# Patient Record
Sex: Male | Born: 1965
Health system: Southern US, Community
[De-identification: ages and names within clinical notes are randomized; demographics above are authoritative.]

## PROBLEM LIST (undated history)

## (undated) DIAGNOSIS — I10 Essential (primary) hypertension: Secondary | ICD-10-CM

## (undated) DIAGNOSIS — C801 Malignant (primary) neoplasm, unspecified: Secondary | ICD-10-CM

## (undated) DIAGNOSIS — M1990 Unspecified osteoarthritis, unspecified site: Secondary | ICD-10-CM

## (undated) DIAGNOSIS — Z87442 Personal history of urinary calculi: Secondary | ICD-10-CM

## (undated) DIAGNOSIS — I447 Left bundle-branch block, unspecified: Secondary | ICD-10-CM

---

## 2012-11-09 ENCOUNTER — Emergency Department (HOSPITAL_COMMUNITY): Payer: BC Managed Care – PPO

## 2012-11-09 ENCOUNTER — Encounter (HOSPITAL_COMMUNITY): Payer: Self-pay | Admitting: Emergency Medicine

## 2012-11-09 ENCOUNTER — Emergency Department (HOSPITAL_COMMUNITY)
Admission: EM | Admit: 2012-11-09 | Discharge: 2012-11-09 | Disposition: A | Discharge status: Home / Self Care | Payer: BC Managed Care – PPO | Attending: Emergency Medicine | Admitting: Emergency Medicine

## 2012-11-09 DIAGNOSIS — S93409A Sprain of unspecified ligament of unspecified ankle, initial encounter: Secondary | ICD-10-CM | POA: Insufficient documentation

## 2012-11-09 DIAGNOSIS — Y9289 Other specified places as the place of occurrence of the external cause: Secondary | ICD-10-CM | POA: Insufficient documentation

## 2012-11-09 DIAGNOSIS — F17200 Nicotine dependence, unspecified, uncomplicated: Secondary | ICD-10-CM | POA: Insufficient documentation

## 2012-11-09 DIAGNOSIS — Y9389 Activity, other specified: Secondary | ICD-10-CM | POA: Insufficient documentation

## 2012-11-09 DIAGNOSIS — R296 Repeated falls: Secondary | ICD-10-CM | POA: Insufficient documentation

## 2012-11-09 NOTE — ED Notes (Signed)
Pt refused aso, pa bryant notified.

## 2012-11-09 NOTE — ED Provider Notes (Signed)
CSN: [id number]     Arrival date & time 11/09/12  4098 History   First MD Initiated Contact with Patient 11/09/12 [phone]     Chief Complaint  Patient presents with  . Foot Pain   (Consider location/radiation/quality/duration/timing/severity/associated sxs/prior Treatment) Patient is a 47 y.o. male presenting with lower extremity pain. The history is provided by the patient.  Foot Pain This is a new problem. The current episode started yesterday. The problem occurs constantly. The problem has been gradually worsening. Pertinent negatives include no abdominal pain, arthralgias, chest pain, coughing, neck pain or numbness. The symptoms are aggravated by standing and walking. He has tried nothing for the symptoms. The treatment provided no relief.    History reviewed. No pertinent past medical history. History reviewed. No pertinent past surgical history. No family history on file. History  Substance Use Topics  . Smoking status: Current Every Day Smoker  . Smokeless tobacco: Not on file  . Alcohol Use: Yes     Comment: occ    Review of Systems  Constitutional: Negative for activity change.       All ROS Neg except as noted in HPI  HENT: Negative for nosebleeds.   Eyes: Negative for photophobia and discharge.  Respiratory: Negative for cough, shortness of breath and wheezing.   Cardiovascular: Negative for chest pain and palpitations.  Gastrointestinal: Negative for abdominal pain and blood in stool.  Genitourinary: Negative for dysuria, frequency and hematuria.  Musculoskeletal: Negative for arthralgias, back pain and neck pain.  Skin: Negative.   Neurological: Negative for dizziness, seizures, speech difficulty and numbness.  Psychiatric/Behavioral: Negative for hallucinations and confusion.    Allergies  Review of patient's allergies indicates no known allergies.  Home Medications  No current outpatient prescriptions on file. BP 108/74  Pulse 78  Temp(Src) 98.6 F (37 C)  (Oral)  Resp 18  Ht 5\' 9"  (1.753 m)  Wt 170 lb (77.111 kg)  BMI 25.09 kg/m2  SpO2 96% Physical Exam  Nursing note and vitals reviewed. Constitutional: He is oriented to person, place, and time. He appears well-developed and well-nourished.  Non-toxic appearance.  HENT:  Head: Normocephalic.  Right Ear: Tympanic membrane and external ear normal.  Left Ear: Tympanic membrane and external ear normal.  Eyes: EOM and lids are normal. Pupils are equal, round, and reactive to light.  Neck: Normal range of motion. Neck supple. Carotid bruit is not present.  Cardiovascular: Normal rate, regular rhythm, normal heart sounds, intact distal pulses and normal pulses.   Pulmonary/Chest: Breath sounds normal. No respiratory distress.  Abdominal: Soft. Bowel sounds are normal. There is no tenderness. There is no guarding.  Musculoskeletal: Normal range of motion.  Pain of the lateral malleolus of the right foot. FROM of the toes. Cap refill less than 2 sec. Achilles intact. No lesions between toes or on the plantar surface. DP pulse 2+  Lymphadenopathy:       Head (right side): No submandibular adenopathy present.       Head (left side): No submandibular adenopathy present.    He has no cervical adenopathy.  Neurological: He is alert and oriented to person, place, and time. He has normal strength. No cranial nerve deficit or sensory deficit.  Skin: Skin is warm and dry.  Psychiatric: He has a normal mood and affect. His speech is normal.    ED Course  Procedures (including critical care time) Labs Review Labs Reviewed - No data to display Imaging Review Dg Foot Complete Right  11/09/2012  CLINICAL DATA:  Pain and swelling in the proximal metatarsals since falling yesterday.  EXAM: RIGHT FOOT COMPLETE - 3+ VIEW  COMPARISON:  None.  FINDINGS: The mineralization and alignment are normal. There is no evidence of acute fracture or dislocation. The alignment at the Lisfranc joint appears normal. There  appears to be mild dorsal forefoot soft tissue swelling.  IMPRESSION: No acute osseous findings demonstrated.   Electronically Signed   By: Roxy Horseman M.D.   On: 11/09/2012 08:49    EKG Interpretation   None       MDM  No diagnosis found. *I have reviewed nursing notes, vital signs, and all appropriate lab and imaging results for this patient.**  Pt states he fell approximately 4 feet from a ladder on last evening. He's been having increasing pain. Pain is worse when he applies weight. X-ray of the right foot and ankle are negative for fracture or dislocation  Patient fitted with an ankle stirrup splint. Patient has his own crutches. Patient advised to apply ice and elevate the ankle. Patient will use ibuprofen every 6 hours for soreness. He is to see orthopedics if not improving.  Kathie Dike, PA-C 11/09/12 [phone]

## 2012-11-09 NOTE — ED Provider Notes (Signed)
Medical screening examination/treatment/procedure(s) were performed by non-physician practitioner and as supervising physician I was immediately available for consultation/collaboration.  Comfort Iversen L Oskar Cretella, MD 11/09/12 1644 

## 2012-11-09 NOTE — ED Notes (Signed)
Pt reports was working out in the yard yesterday and fell.  C/O pain to lateral part of r foot.

## 2015-02-20 IMAGING — CR DG FOOT COMPLETE 3+V*R*
3 series · 3 of 3 positions shown · non-contrast
Comparison: None.

CLINICAL DATA: Pain and swelling in the proximal metatarsals since
falling yesterday.

EXAM:
RIGHT FOOT COMPLETE - 3+ VIEW

[view not recorded (1 of 3)]
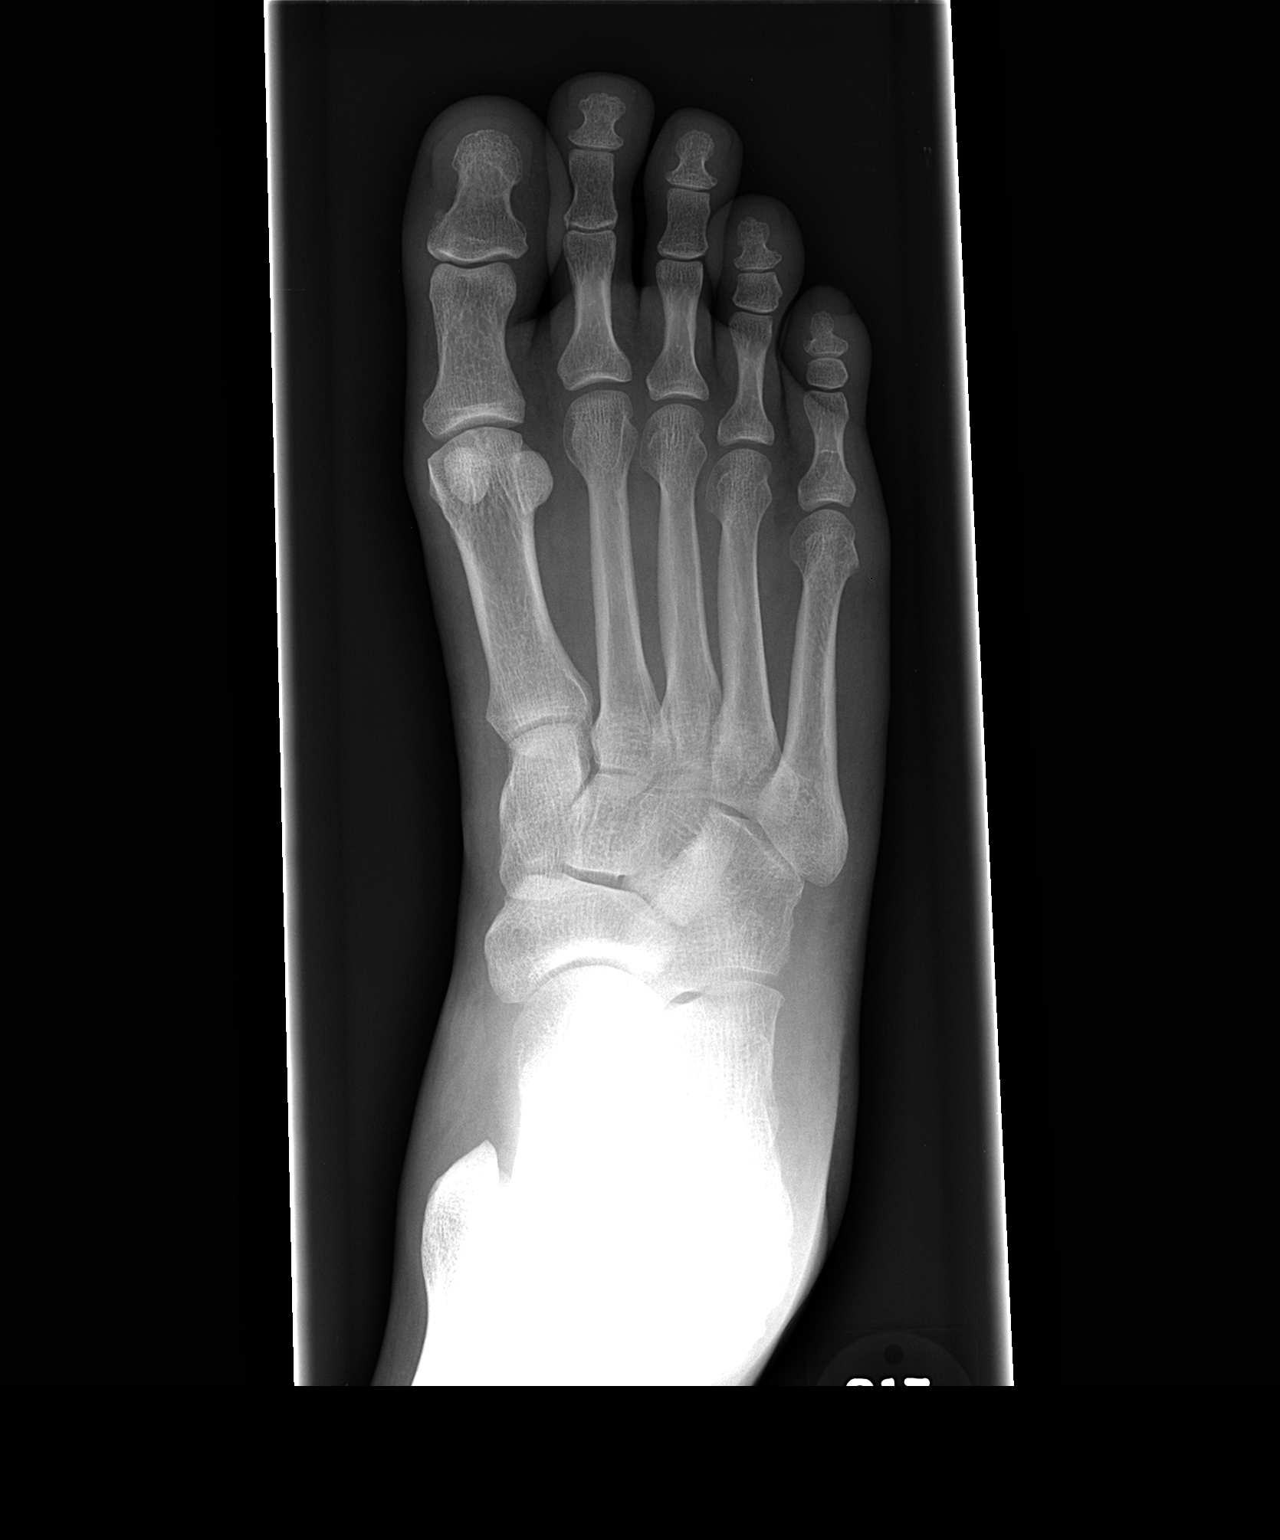

[view not recorded (2 of 3)]
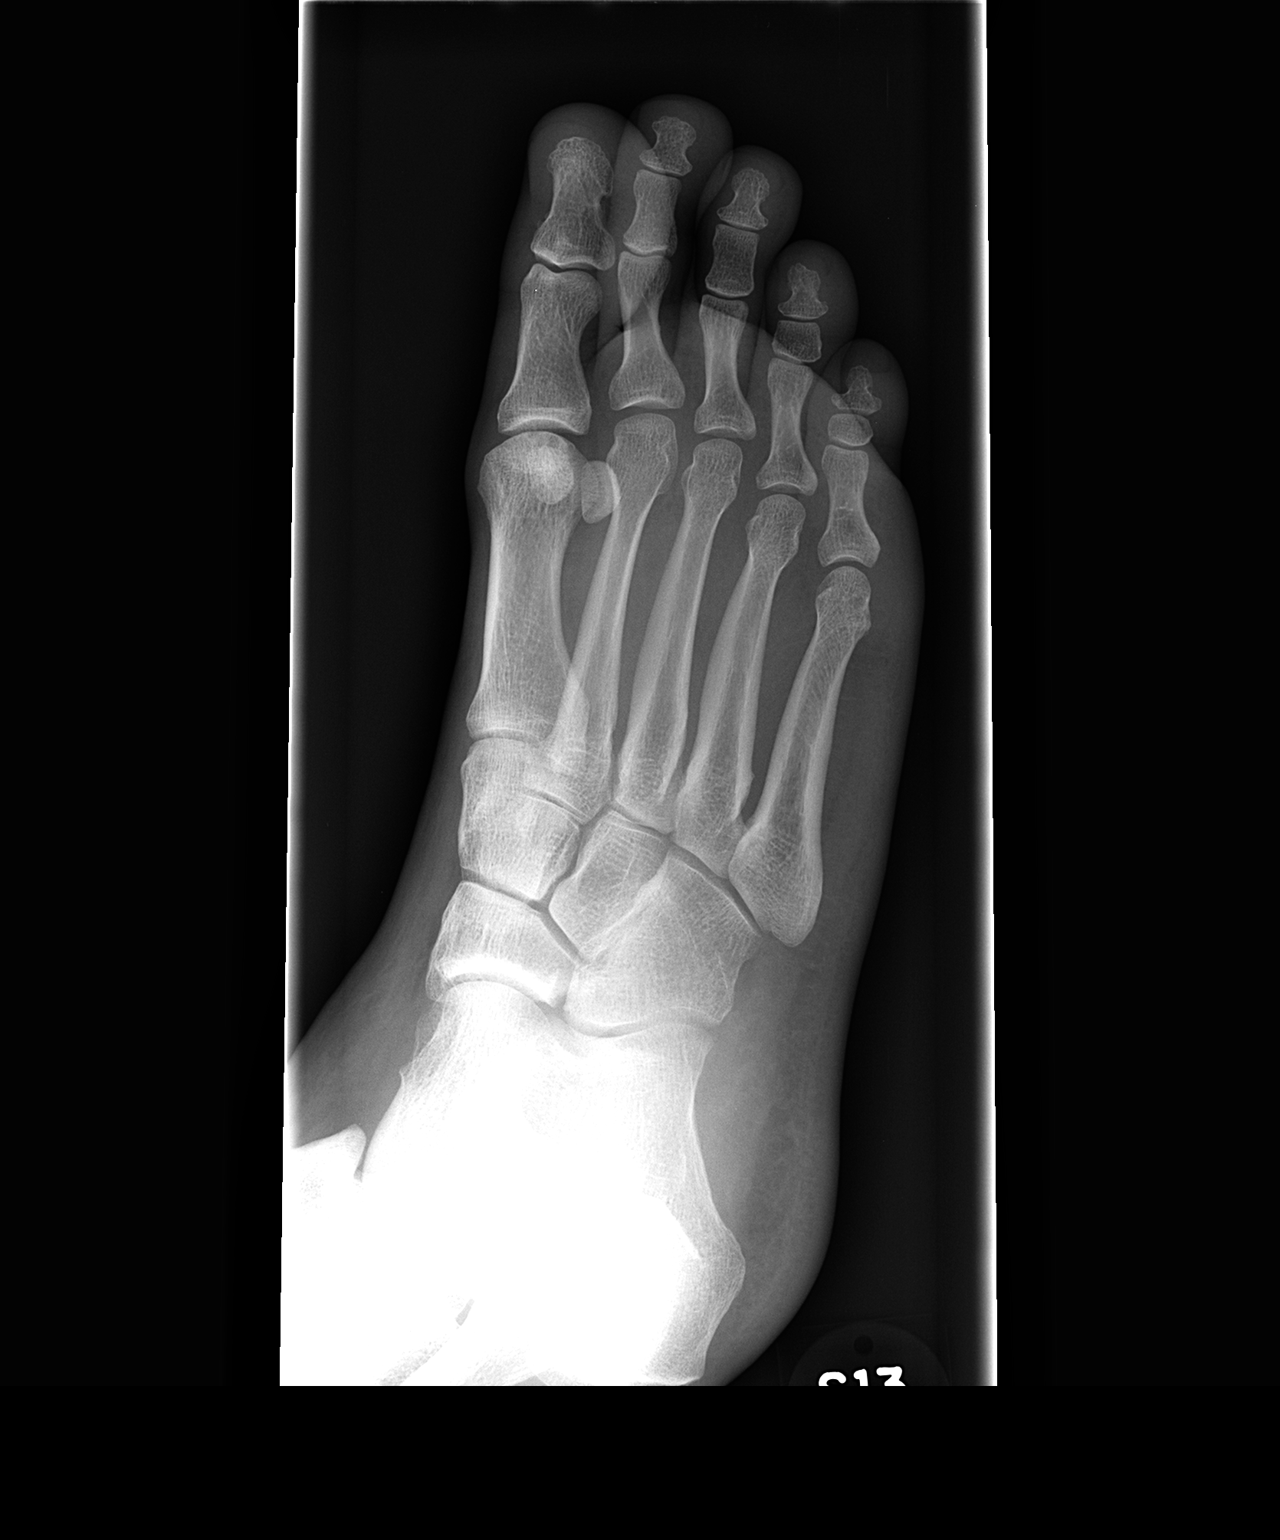

[view not recorded (3 of 3)]
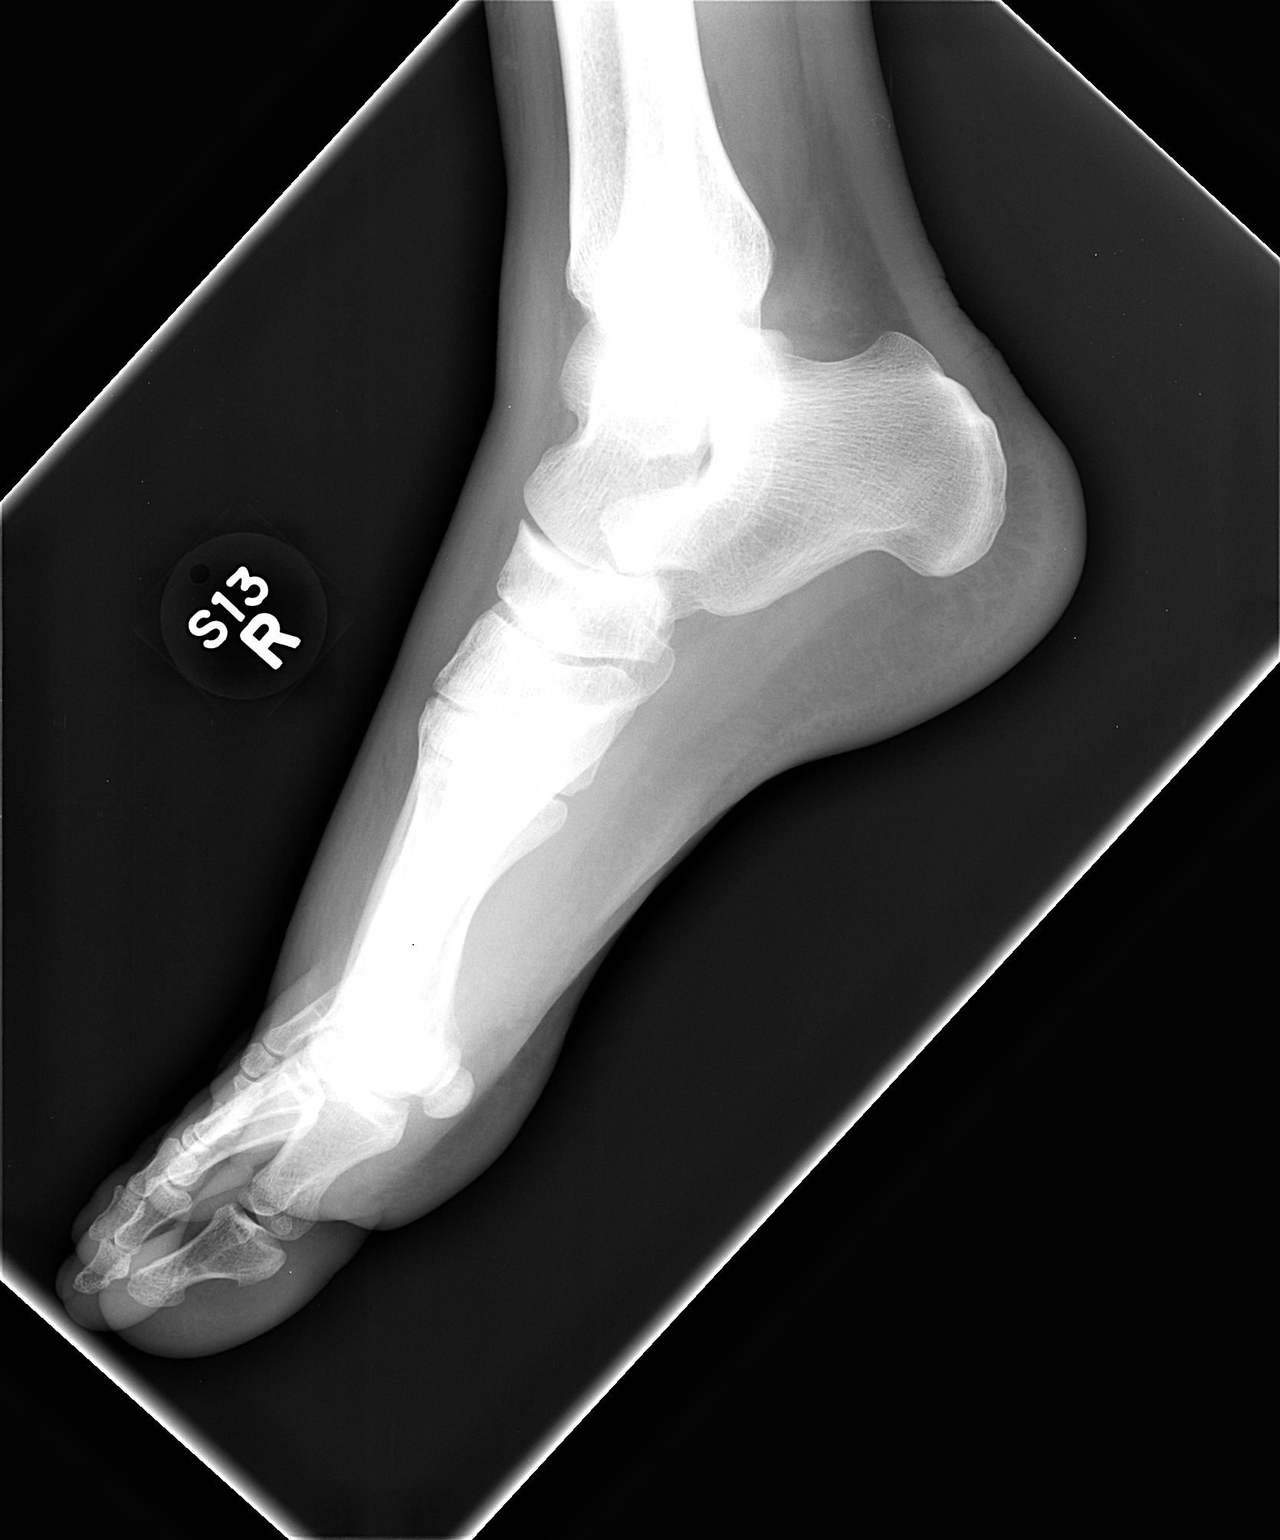

[3 of 3 positions shown; findings below may reference images not displayed]

FINDINGS: The mineralization and alignment are normal. There is no evidence of
acute fracture or dislocation. The alignment at the Lisfranc joint
appears normal. There appears to be mild dorsal forefoot soft tissue
swelling.
IMPRESSION: No acute osseous findings demonstrated.

## 2015-07-09 DIAGNOSIS — R030 Elevated blood-pressure reading, without diagnosis of hypertension: Secondary | ICD-10-CM | POA: Diagnosis not present

## 2016-05-26 DIAGNOSIS — Z6825 Body mass index (BMI) 25.0-25.9, adult: Secondary | ICD-10-CM | POA: Diagnosis not present

## 2016-05-26 DIAGNOSIS — R030 Elevated blood-pressure reading, without diagnosis of hypertension: Secondary | ICD-10-CM | POA: Diagnosis not present

## 2016-06-27 DIAGNOSIS — Z125 Encounter for screening for malignant neoplasm of prostate: Secondary | ICD-10-CM | POA: Diagnosis not present

## 2016-06-27 DIAGNOSIS — Z0001 Encounter for general adult medical examination with abnormal findings: Secondary | ICD-10-CM | POA: Diagnosis not present

## 2016-07-04 DIAGNOSIS — R030 Elevated blood-pressure reading, without diagnosis of hypertension: Secondary | ICD-10-CM | POA: Diagnosis not present

## 2016-10-05 DIAGNOSIS — Z6825 Body mass index (BMI) 25.0-25.9, adult: Secondary | ICD-10-CM | POA: Diagnosis not present

## 2016-10-05 DIAGNOSIS — Z23 Encounter for immunization: Secondary | ICD-10-CM | POA: Diagnosis not present

## 2016-10-05 DIAGNOSIS — I10 Essential (primary) hypertension: Secondary | ICD-10-CM | POA: Diagnosis not present

## 2017-01-25 DIAGNOSIS — K1370 Unspecified lesions of oral mucosa: Secondary | ICD-10-CM | POA: Diagnosis not present

## 2017-01-25 DIAGNOSIS — Z6826 Body mass index (BMI) 26.0-26.9, adult: Secondary | ICD-10-CM | POA: Diagnosis not present

## 2017-01-25 DIAGNOSIS — I10 Essential (primary) hypertension: Secondary | ICD-10-CM | POA: Diagnosis not present

## 2017-06-08 ENCOUNTER — Encounter (INDEPENDENT_AMBULATORY_CARE_PROVIDER_SITE_OTHER): Payer: Self-pay | Admitting: *Deleted

## 2017-07-06 ENCOUNTER — Encounter (INDEPENDENT_AMBULATORY_CARE_PROVIDER_SITE_OTHER): Payer: Self-pay | Admitting: *Deleted

## 2017-07-07 ENCOUNTER — Other Ambulatory Visit (INDEPENDENT_AMBULATORY_CARE_PROVIDER_SITE_OTHER): Payer: Self-pay | Admitting: *Deleted

## 2017-07-07 DIAGNOSIS — Z01810 Encounter for preprocedural cardiovascular examination: Secondary | ICD-10-CM | POA: Insufficient documentation

## 2017-07-07 DIAGNOSIS — Z1211 Encounter for screening for malignant neoplasm of colon: Secondary | ICD-10-CM | POA: Insufficient documentation

## 2017-08-02 DIAGNOSIS — I10 Essential (primary) hypertension: Secondary | ICD-10-CM | POA: Diagnosis not present

## 2017-08-02 DIAGNOSIS — Z6827 Body mass index (BMI) 27.0-27.9, adult: Secondary | ICD-10-CM | POA: Diagnosis not present

## 2017-08-22 ENCOUNTER — Encounter (INDEPENDENT_AMBULATORY_CARE_PROVIDER_SITE_OTHER): Payer: Self-pay | Admitting: *Deleted

## 2017-08-22 ENCOUNTER — Telehealth (INDEPENDENT_AMBULATORY_CARE_PROVIDER_SITE_OTHER): Payer: Self-pay | Admitting: *Deleted

## 2017-08-22 NOTE — Telephone Encounter (Signed)
Patient needs suprep 

## 2017-08-23 MED ORDER — SUPREP BOWEL PREP KIT 17.5-3.13-1.6 GM/177ML PO SOLN: 1.0000 | Freq: Once | ORAL | 0 refills | Status: AC

## 2017-09-04 ENCOUNTER — Telehealth (INDEPENDENT_AMBULATORY_CARE_PROVIDER_SITE_OTHER): Payer: Self-pay | Admitting: *Deleted

## 2017-09-04 NOTE — Telephone Encounter (Signed)
Referring MD/PCP: fagan   Procedure: tcs  Reason/Indication:  screening  Has patient had this procedure before?  no  If so, when, by whom and where?    Is there a family history of colon cancer?  no  Who?  What age when diagnosed?    Is patient diabetic?   no      Does patient have prosthetic heart valve or mechanical valve?  no  Do you have a pacemaker?  no  Has patient ever had endocarditis? no  Has patient had joint replacement within last 12 months?  no  Is patient constipated or do they take laxatives? no  Does patient have a history of alcohol/drug use?  no  Is patient on blood thinner such as Coumadin, Plavix and/or Aspirin? no  Medications: lisinopril 10 mg daily  Allergies: nkda  Medication Adjustment per Dr Lindi Adie, NP:   Procedure date & time: 09/28/17 at 730

## 2017-09-05 NOTE — Telephone Encounter (Signed)
agree

## 2017-09-20 DIAGNOSIS — I10 Essential (primary) hypertension: Secondary | ICD-10-CM | POA: Diagnosis not present

## 2017-09-20 DIAGNOSIS — R05 Cough: Secondary | ICD-10-CM | POA: Diagnosis not present

## 2017-09-28 ENCOUNTER — Encounter (HOSPITAL_COMMUNITY): Payer: Self-pay | Admitting: *Deleted

## 2017-09-28 ENCOUNTER — Ambulatory Visit (HOSPITAL_COMMUNITY)
Admission: RE | Admit: 2017-09-28 | Discharge: 2017-09-28 | Disposition: A | Discharge status: Home / Self Care | Payer: BLUE CROSS/BLUE SHIELD | Source: Ambulatory Visit | Attending: Internal Medicine | Admitting: Internal Medicine

## 2017-09-28 ENCOUNTER — Encounter (HOSPITAL_COMMUNITY)
Admission: RE | Disposition: A | Discharge status: Home / Self Care | Payer: Self-pay | Source: Ambulatory Visit | Attending: Internal Medicine

## 2017-09-28 ENCOUNTER — Other Ambulatory Visit: Payer: Self-pay

## 2017-09-28 DIAGNOSIS — Z87891 Personal history of nicotine dependence: Secondary | ICD-10-CM | POA: Insufficient documentation

## 2017-09-28 DIAGNOSIS — Z1211 Encounter for screening for malignant neoplasm of colon: Secondary | ICD-10-CM | POA: Insufficient documentation

## 2017-09-28 DIAGNOSIS — I10 Essential (primary) hypertension: Secondary | ICD-10-CM | POA: Insufficient documentation

## 2017-09-28 DIAGNOSIS — K635 Polyp of colon: Secondary | ICD-10-CM | POA: Diagnosis not present

## 2017-09-28 DIAGNOSIS — Z800 Family history of malignant neoplasm of digestive organs: Secondary | ICD-10-CM | POA: Insufficient documentation

## 2017-09-28 DIAGNOSIS — K621 Rectal polyp: Secondary | ICD-10-CM | POA: Diagnosis not present

## 2017-09-28 DIAGNOSIS — D125 Benign neoplasm of sigmoid colon: Secondary | ICD-10-CM | POA: Diagnosis not present

## 2017-09-28 DIAGNOSIS — Z01810 Encounter for preprocedural cardiovascular examination: Secondary | ICD-10-CM | POA: Insufficient documentation

## 2017-09-28 DIAGNOSIS — Z7982 Long term (current) use of aspirin: Secondary | ICD-10-CM | POA: Diagnosis not present

## 2017-09-28 DIAGNOSIS — K5730 Diverticulosis of large intestine without perforation or abscess without bleeding: Secondary | ICD-10-CM | POA: Insufficient documentation

## 2017-09-28 DIAGNOSIS — Z79899 Other long term (current) drug therapy: Secondary | ICD-10-CM | POA: Diagnosis not present

## 2017-09-28 DIAGNOSIS — D127 Benign neoplasm of rectosigmoid junction: Secondary | ICD-10-CM | POA: Diagnosis not present

## 2017-09-28 HISTORY — PX: POLYPECTOMY: SHX5525

## 2017-09-28 HISTORY — PX: COLONOSCOPY: SHX5424

## 2017-09-28 HISTORY — DX: Essential (primary) hypertension: I10

## 2017-09-28 SURGERY — COLONOSCOPY
Anesthesia: Moderate Sedation

## 2017-09-28 MED ORDER — MIDAZOLAM HCL 5 MG/5ML IJ SOLN
INTRAMUSCULAR | Status: DC | PRN
  Administered 2017-09-28: 2 mg via INTRAVENOUS
  Administered 2017-09-28: 1 mg via INTRAVENOUS
  Administered 2017-09-28: 2 mg via INTRAVENOUS
  Administered 2017-09-28: 1 mg via INTRAVENOUS

## 2017-09-28 MED ORDER — SODIUM CHLORIDE 0.9 % IV SOLN
INTRAVENOUS | Status: DC
  Administered 2017-09-28: 07:00:00 via INTRAVENOUS

## 2017-09-28 MED ORDER — MIDAZOLAM HCL 5 MG/5ML IJ SOLN
INTRAMUSCULAR | Status: AC
  Filled 2017-09-28: qty 10

## 2017-09-28 MED ORDER — STERILE WATER FOR IRRIGATION IR SOLN
Status: DC | PRN
  Administered 2017-09-28: 08:00:00

## 2017-09-28 MED ORDER — MEPERIDINE HCL 50 MG/ML IJ SOLN
INTRAMUSCULAR | Status: DC | PRN
  Administered 2017-09-28 (×2): 25 mg via INTRAVENOUS

## 2017-09-28 MED ORDER — MEPERIDINE HCL 50 MG/ML IJ SOLN
INTRAMUSCULAR | Status: AC
  Filled 2017-09-28: qty 1

## 2017-09-28 NOTE — Op Note (Signed)
[facility] Patient Name: Brandon Avila Procedure Date: 09/28/2017 7:06 AM MRN: [id number] Date of Birth: [birth year] Attending MD: Hildred Laser , MD CSN: [id number] Age: 52 Admit Type: Outpatient Procedure:                Colonoscopy Indications:              Screening for colorectal malignant neoplasm Providers:                Hildred Laser, MD, Otis Peak B. Sharon Seller, RN, Hinton Rao, RN Referring MD:             Asencion Noble, MD Medicines:                Meperidine 50 mg IV, Midazolam 6 mg IV Complications:            No immediate complications. Estimated Blood Loss:     Estimated blood loss was minimal. Procedure:                Pre-Anesthesia Assessment:                           - Prior to the procedure, a History and Physical                            was performed, and patient medications and                            allergies were reviewed. The patient's tolerance of                            previous anesthesia was also reviewed. The risks                            and benefits of the procedure and the sedation                            options and risks were discussed with the patient.                            All questions were answered, and informed consent                            was obtained. Prior Anticoagulants: The patient has                            taken no previous anticoagulant or antiplatelet                            agents. ASA Grade Assessment: I - A normal, healthy                            patient. After reviewing the risks and benefits,  the patient was deemed in satisfactory condition to                            undergo the procedure.                           After obtaining informed consent, the colonoscope                            was passed under direct vision. Throughout the                            procedure, the patient's blood pressure, pulse, and       oxygen saturations were monitored continuously. The                            [id number]-[id number] ([id number]) scope was introduced through                            the anus and advanced to the the cecum, identified                            by appendiceal orifice and ileocecal valve. The                            colonoscopy was performed without difficulty. The                            patient tolerated the procedure well. The quality                            of the bowel preparation was good. The ileocecal                            valve, appendiceal orifice, and rectum were                            photographed. Scope In: 7:36:06 AM Scope Out: 4:03:47 AM Scope Withdrawal Time: 0 hours 14 minutes 54 seconds  Total Procedure Duration: 0 hours 20 minutes 2 seconds  Findings:      The perianal and digital rectal examinations were normal.      Four sessile polyps were found in the rectum and sigmoid colon. The       polyps were small in size. These were biopsied with a cold forceps for       histology. The pathology specimen was placed into Bottle Number 1.      A few medium-mouthed diverticula were found in the sigmoid colon.      The retroflexed view of the distal rectum and anal verge was normal and       showed no anal or rectal abnormalities. Impression:               - Four small polyps in the rectum and in the  sigmoid colon. Biopsied.                           - Diverticulosis in the sigmoid colon. Moderate Sedation:      Moderate (conscious) sedation was administered by the endoscopy nurse       and supervised by the endoscopist. The following parameters were       monitored: oxygen saturation, heart rate, blood pressure, CO2       capnography and response to care. Total physician intraservice time was       25 minutes. Recommendation:           - Patient has a contact number available for                            emergencies. The signs and  symptoms of potential                            delayed complications were discussed with the                            patient. Return to normal activities tomorrow.                            Written discharge instructions were provided to the                            patient.                           - High fiber diet today.                           - Continue present medications.                           - No aspirin, ibuprofen, naproxen, or other                            non-steroidal anti-inflammatory drugs for 1 day.                           - Await pathology results.                           - Repeat colonoscopy is recommended. The                            colonoscopy date will be determined after pathology                            results from today's exam become available for                            review. Procedure Code(s):        --- Professional ---  [phone], Colonoscopy, flexible; with biopsy, single                            or multiple                           G0500, Moderate sedation services provided by the                            same physician or other qualified health care                            professional performing a gastrointestinal                            endoscopic service that sedation supports,                            requiring the presence of an independent trained                            observer to assist in the monitoring of the                            patient's level of consciousness and physiological                            status; initial 15 minutes of intra-service time;                            patient age 65 years or older (additional time may                            be reported with [phone], as appropriate)                           [phone], Moderate sedation services provided by the                            same physician or other qualified health care                             professional performing the diagnostic or                            therapeutic service that the sedation supports,                            requiring the presence of an independent trained                            observer to assist in the monitoring of the                            patient's  level of consciousness and physiological                            status; each additional 15 minutes intraservice                            time (List separately in addition to code for                            primary service) Diagnosis Code(s):        --- Professional ---                           Z12.11, Encounter for screening for malignant                            neoplasm of colon                           K62.1, Rectal polyp                           D12.5, Benign neoplasm of sigmoid colon                           K57.30, Diverticulosis of large intestine without                            perforation or abscess without bleeding CPT copyright 2017 American [employer] Association. All rights reserved. The codes documented in this report are preliminary and upon coder review may  be revised to meet current compliance requirements. Hildred Laser, MD Hildred Laser, MD 09/28/2017 8:10:33 AM This report has been signed electronically. Number of Addenda: 0

## 2017-09-28 NOTE — Discharge Instructions (Signed)
Diverticulosis Diverticulosis is a condition that develops when small pouches (diverticula) form in the wall of the large intestine (colon). The colon is where water is absorbed and stool is formed. The pouches form when the inside layer of the colon pushes through weak spots in the outer layers of the colon. You may have a few pouches or many of them. What are the causes? The cause of this condition is not known. What increases the risk? The following factors may make you more likely to develop this condition:  Being older than age 27. Your risk for this condition increases with age. Diverticulosis is rare among people younger than age 36. By age 28, many people have it.  Eating a low-fiber diet.  Having frequent constipation.  Being overweight.  Not getting enough exercise.  Smoking.  Taking over-the-counter pain medicines, like aspirin and ibuprofen.  Having a family history of diverticulosis.  What are the signs or symptoms? In most people, there are no symptoms of this condition. If you do have symptoms, they may include:  Bloating.  Cramps in the abdomen.  Constipation or diarrhea.  Pain in the lower left side of the abdomen.  How is this diagnosed? This condition is most often diagnosed during an exam for other colon problems. Because diverticulosis usually has no symptoms, it often cannot be diagnosed independently. This condition may be diagnosed by:  Using a flexible scope to examine the colon (colonoscopy).  Taking an X-ray of the colon after dye has been put into the colon (barium enema).  Doing a CT scan.  How is this treated? You may not need treatment for this condition if you have never developed an infection related to diverticulosis. If you have had an infection before, treatment may include:  Eating a high-fiber diet. This may include eating more fruits, vegetables, and grains.  Taking a fiber supplement.  Taking a live bacteria supplement  (probiotic).  Taking medicine to relax your colon.  Taking antibiotic medicines.  Follow these instructions at home:  Drink 6-8 glasses of water or more each day to prevent constipation.  Try not to strain when you have a bowel movement.  If you have had an infection before: ? Eat more fiber as directed by your health care provider or your diet and nutrition specialist (dietitian). ? Take a fiber supplement or probiotic, if your health care provider approves.  Take over-the-counter and prescription medicines only as told by your health care provider.  If you were prescribed an antibiotic, take it as told by your health care provider. Do not stop taking the antibiotic even if you start to feel better.  Keep all follow-up visits as told by your health care provider. This is important. Contact a health care provider if:  You have pain in your abdomen.  You have bloating.  You have cramps.  You have not had a bowel movement in 3 days. Get help right away if:  Your pain gets worse.  Your bloating becomes very bad.  You have a fever or chills, and your symptoms suddenly get worse.  You vomit.  You have bowel movements that are bloody or black.  You have bleeding from your rectum. Summary  Diverticulosis is a condition that develops when small pouches (diverticula) form in the wall of the large intestine (colon).  You may have a few pouches or many of them.  This condition is most often diagnosed during an exam for other colon problems.  If you have had an  infection related to diverticulosis, treatment may include increasing the fiber in your diet, taking supplements, or taking medicines. This information is not intended to replace advice given to you by your health care provider. Make sure you discuss any questions you have with your health care provider. Document Released: 10/01/2003 Document Revised: 11/23/2015 Document Reviewed: 11/23/2015 Elsevier Interactive  Patient Education  2017 [place]. Colon Polyps Polyps are tissue growths inside the body. Polyps can grow in many places, including the large intestine (colon). A polyp may be a round bump or a mushroom-shaped growth. You could have one polyp or several. Most colon polyps are noncancerous (benign). However, some colon polyps can become cancerous over time. What are the causes? The exact cause of colon polyps is not known. What increases the risk? This condition is more likely to develop in people who:  Have a family history of colon cancer or colon polyps.  Are older than 54 or older than 45 if they are African American.  Have inflammatory bowel disease, such as ulcerative colitis or Crohn disease.  Are overweight.  Smoke cigarettes.  Do not get enough exercise.  Drink too much alcohol.  Eat a diet that is: ? High in fat and red meat. ? Low in fiber.  Had childhood cancer that was treated with abdominal radiation.  What are the signs or symptoms? Most polyps do not cause symptoms. If you have symptoms, they may include:  Blood coming from your rectum when having a bowel movement.  Blood in your stool.The stool may look dark red or black.  A change in bowel habits, such as constipation or diarrhea.  How is this diagnosed? This condition is diagnosed with a colonoscopy. This is a procedure that uses a lighted, flexible scope to look at the inside of your colon. How is this treated? Treatment for this condition involves removing any polyps that are found. Those polyps will then be tested for cancer. If cancer is found, your health care provider will talk to you about options for colon cancer treatment. Follow these instructions at home: Diet  Eat plenty of fiber, such as fruits, vegetables, and whole grains.  Eat foods that are high in calcium and vitamin D, such as milk, cheese, yogurt, eggs, liver, fish, and broccoli.  Limit foods high in fat, red meats, and  processed meats, such as hot dogs, sausage, bacon, and lunch meats.  Maintain a healthy weight, or lose weight if recommended by your health care provider. General instructions  Do not smoke cigarettes.  Do not drink alcohol excessively.  Keep all follow-up visits as told by your health care provider. This is important. This includes keeping regularly scheduled colonoscopies. Talk to your health care provider about when you need a colonoscopy.  Exercise every day or as told by your health care provider. Contact a health care provider if:  You have new or worsening bleeding during a bowel movement.  You have new or increased blood in your stool.  You have a change in bowel habits.  You unexpectedly lose weight. This information is not intended to replace advice given to you by your health care provider. Make sure you discuss any questions you have with your health care provider. Document Released: 09/30/2003 Document Revised: 06/11/2015 Document Reviewed: 11/24/2014 Elsevier Interactive Patient Education  2018 [employer]. Colonoscopy, Adult, Care After This sheet gives you information about how to care for yourself after your procedure. Your health care provider may also give you more specific instructions. If  you have problems or questions, contact your health care provider. What can I expect after the procedure? After the procedure, it is common to have:  A small amount of blood in your stool for 24 hours after the procedure.  Some gas.  Mild abdominal cramping or bloating.  Follow these instructions at home: General instructions   For the first 24 hours after the procedure: ? Do not drive or use machinery. ? Do not sign important documents. ? Do not drink alcohol. ? Do your regular daily activities at a slower pace than normal. ? Eat soft, easy-to-digest foods. ? Rest often.  Take over-the-counter or prescription medicines only as told by your health care  provider.  It is up to you to get the results of your procedure. Ask your health care provider, or the department performing the procedure, when your results will be ready. Relieving cramping and bloating  Try walking around when you have cramps or feel bloated.  Apply heat to your abdomen as told by your health care provider. Use a heat source that your health care provider recommends, such as a moist heat pack or a heating pad. ? Place a towel between your skin and the heat source. ? Leave the heat on for 20-30 minutes. ? Remove the heat if your skin turns bright red. This is especially important if you are unable to feel pain, heat, or cold. You may have a greater risk of getting burned. Eating and drinking  Drink enough fluid to keep your urine clear or pale yellow.  Resume your normal diet as instructed by your health care provider. Avoid heavy or fried foods that are hard to digest.  Avoid drinking alcohol for as long as instructed by your health care provider. Contact a health care provider if:  You have blood in your stool 2-3 days after the procedure. Get help right away if:  You have more than a small spotting of blood in your stool.  You pass large blood clots in your stool.  Your abdomen is swollen.  You have nausea or vomiting.  You have a fever.  You have increasing abdominal pain that is not relieved with medicine. This information is not intended to replace advice given to you by your health care provider. Make sure you discuss any questions you have with your health care provider. Document Released: 08/18/2003 Document Revised: 09/28/2015 Document Reviewed: 03/17/2015 Elsevier Interactive Patient Education  2018 [employer]. No aspirin or NSAIDs for 24 hours. Resume losartan as before. Resume usual diet. No driving for 24 hours. Physician will call with biopsy results.

## 2017-09-28 NOTE — H&P (Signed)
Brandon Avila is an 51 y.o. male.   Chief Complaint: Patient is here for colonoscopy. HPI: Patient is 52 year old Caucasian male who is here for screening colonoscopy.  He denies abdominal pain change in bowel habits or rectal bleeding. He states his great aunt on mother side had colon cancer.  He does not have any other family members with such history.  Past Medical History:  Diagnosis Date  . Hypertension     History reviewed. No pertinent surgical history.  History reviewed. No pertinent family history. Social History:  reports that he has quit smoking. He has quit using smokeless tobacco. He reports that he drinks alcohol. He reports that he does not use drugs.  Allergies: No Known Allergies  Medications Prior to Admission  Medication Sig Dispense Refill  . aspirin 325 MG EC tablet Take 650 mg by mouth daily as needed for pain.     Marland Kitchen losartan (COZAAR) 50 MG tablet Take 50 mg by mouth daily.  4    No results found for this or any previous visit (from the past 48 hour(s)). No results found.  ROS  Blood pressure 121/83, pulse 66, temperature 97.8 F (36.6 C), temperature source Oral, resp. rate 16, height 5\' 9"  (1.753 m), weight 81.6 kg, SpO2 97 %. Physical Exam  Constitutional: He appears well-developed and well-nourished.  HENT:  Mouth/Throat: Oropharynx is clear and moist.  Eyes: Conjunctivae are normal. No scleral icterus.  Neck: No thyromegaly present.  Cardiovascular: Normal rate, regular rhythm and normal heart sounds.  No murmur heard. Respiratory: Effort normal and breath sounds normal.  GI: Soft. He exhibits no distension.  Musculoskeletal: He exhibits no edema.  Lymphadenopathy:    He has no cervical adenopathy.  Neurological: He is alert.  Skin: Skin is warm.     Assessment/Plan Average risk screening colonoscopy.  Brandon Laser, MD 09/28/2017, 7:27 AM

## 2017-10-04 ENCOUNTER — Encounter (HOSPITAL_COMMUNITY): Payer: Self-pay | Admitting: Internal Medicine

## 2018-02-21 DIAGNOSIS — I10 Essential (primary) hypertension: Secondary | ICD-10-CM | POA: Diagnosis not present

## 2018-02-21 DIAGNOSIS — K210 Gastro-esophageal reflux disease with esophagitis: Secondary | ICD-10-CM | POA: Diagnosis not present

## 2018-08-21 DIAGNOSIS — I10 Essential (primary) hypertension: Secondary | ICD-10-CM | POA: Diagnosis not present

## 2018-08-21 DIAGNOSIS — Z125 Encounter for screening for malignant neoplasm of prostate: Secondary | ICD-10-CM | POA: Diagnosis not present

## 2018-08-21 DIAGNOSIS — Z79899 Other long term (current) drug therapy: Secondary | ICD-10-CM | POA: Diagnosis not present

## 2018-08-21 DIAGNOSIS — G4700 Insomnia, unspecified: Secondary | ICD-10-CM | POA: Diagnosis not present

## 2018-08-21 DIAGNOSIS — K219 Gastro-esophageal reflux disease without esophagitis: Secondary | ICD-10-CM | POA: Diagnosis not present

## 2018-08-28 DIAGNOSIS — I10 Essential (primary) hypertension: Secondary | ICD-10-CM | POA: Diagnosis not present

## 2018-08-28 DIAGNOSIS — R7301 Impaired fasting glucose: Secondary | ICD-10-CM | POA: Diagnosis not present

## 2018-10-04 ENCOUNTER — Other Ambulatory Visit: Payer: Self-pay | Admitting: *Deleted

## 2018-10-04 DIAGNOSIS — Z20822 Contact with and (suspected) exposure to covid-19: Secondary | ICD-10-CM

## 2018-10-05 LAB — NOVEL CORONAVIRUS, NAA: SARS-CoV-2, NAA: DETECTED — AB

## 2019-02-28 DIAGNOSIS — R7301 Impaired fasting glucose: Secondary | ICD-10-CM | POA: Diagnosis not present

## 2019-02-28 DIAGNOSIS — I10 Essential (primary) hypertension: Secondary | ICD-10-CM | POA: Diagnosis not present

## 2019-08-21 DIAGNOSIS — R7301 Impaired fasting glucose: Secondary | ICD-10-CM | POA: Diagnosis not present

## 2019-08-21 DIAGNOSIS — I10 Essential (primary) hypertension: Secondary | ICD-10-CM | POA: Diagnosis not present

## 2019-08-21 DIAGNOSIS — Z125 Encounter for screening for malignant neoplasm of prostate: Secondary | ICD-10-CM | POA: Diagnosis not present

## 2019-08-21 DIAGNOSIS — N529 Male erectile dysfunction, unspecified: Secondary | ICD-10-CM | POA: Diagnosis not present

## 2019-08-21 DIAGNOSIS — Z79899 Other long term (current) drug therapy: Secondary | ICD-10-CM | POA: Diagnosis not present

## 2019-08-28 DIAGNOSIS — K219 Gastro-esophageal reflux disease without esophagitis: Secondary | ICD-10-CM | POA: Diagnosis not present

## 2019-08-28 DIAGNOSIS — I10 Essential (primary) hypertension: Secondary | ICD-10-CM | POA: Diagnosis not present

## 2019-11-27 DIAGNOSIS — I10 Essential (primary) hypertension: Secondary | ICD-10-CM | POA: Diagnosis not present

## 2019-11-27 DIAGNOSIS — K219 Gastro-esophageal reflux disease without esophagitis: Secondary | ICD-10-CM | POA: Diagnosis not present

## 2019-11-27 DIAGNOSIS — Z23 Encounter for immunization: Secondary | ICD-10-CM | POA: Diagnosis not present

## 2020-03-18 DIAGNOSIS — I10 Essential (primary) hypertension: Secondary | ICD-10-CM | POA: Diagnosis not present

## 2020-03-18 DIAGNOSIS — K219 Gastro-esophageal reflux disease without esophagitis: Secondary | ICD-10-CM | POA: Diagnosis not present

## 2020-08-26 DIAGNOSIS — K219 Gastro-esophageal reflux disease without esophagitis: Secondary | ICD-10-CM | POA: Diagnosis not present

## 2020-08-26 DIAGNOSIS — I10 Essential (primary) hypertension: Secondary | ICD-10-CM | POA: Diagnosis not present

## 2021-01-06 DIAGNOSIS — E119 Type 2 diabetes mellitus without complications: Secondary | ICD-10-CM | POA: Diagnosis not present

## 2021-01-06 DIAGNOSIS — K219 Gastro-esophageal reflux disease without esophagitis: Secondary | ICD-10-CM | POA: Diagnosis not present

## 2021-01-06 DIAGNOSIS — I10 Essential (primary) hypertension: Secondary | ICD-10-CM | POA: Diagnosis not present

## 2021-01-06 DIAGNOSIS — Z79899 Other long term (current) drug therapy: Secondary | ICD-10-CM | POA: Diagnosis not present

## 2021-01-06 DIAGNOSIS — Z125 Encounter for screening for malignant neoplasm of prostate: Secondary | ICD-10-CM | POA: Diagnosis not present

## 2021-01-14 DIAGNOSIS — I10 Essential (primary) hypertension: Secondary | ICD-10-CM | POA: Diagnosis not present

## 2021-01-14 DIAGNOSIS — R7309 Other abnormal glucose: Secondary | ICD-10-CM | POA: Diagnosis not present

## 2021-01-14 DIAGNOSIS — U071 COVID-19: Secondary | ICD-10-CM | POA: Diagnosis not present

## 2021-09-14 ENCOUNTER — Telehealth: Payer: Self-pay | Admitting: Family Medicine

## 2021-09-14 DIAGNOSIS — J208 Acute bronchitis due to other specified organisms: Secondary | ICD-10-CM

## 2021-09-15 MED ORDER — PREDNISONE 10 MG (21) PO TBPK: ORAL_TABLET | ORAL | 0 refills | Status: DC

## 2021-09-15 MED ORDER — BENZONATATE 100 MG PO CAPS: 100.0000 mg | ORAL_CAPSULE | Freq: Two times a day (BID) | ORAL | 0 refills | Status: DC | PRN

## 2021-11-08 DIAGNOSIS — R7309 Other abnormal glucose: Secondary | ICD-10-CM | POA: Diagnosis not present

## 2021-11-08 DIAGNOSIS — I10 Essential (primary) hypertension: Secondary | ICD-10-CM | POA: Diagnosis not present

## 2022-01-15 ENCOUNTER — Ambulatory Visit
Admission: EM | Admit: 2022-01-15 | Discharge: 2022-01-15 | Disposition: A | Discharge status: Home / Self Care | Payer: 59

## 2022-01-15 DIAGNOSIS — R0981 Nasal congestion: Secondary | ICD-10-CM | POA: Diagnosis not present

## 2022-01-15 DIAGNOSIS — R053 Chronic cough: Secondary | ICD-10-CM | POA: Diagnosis not present

## 2022-01-15 MED ORDER — CETIRIZINE HCL 10 MG PO TABS: 10.0000 mg | ORAL_TABLET | Freq: Every day | ORAL | 2 refills | Status: DC

## 2022-01-15 MED ORDER — PREDNISONE 20 MG PO TABS: 40.0000 mg | ORAL_TABLET | Freq: Every day | ORAL | 0 refills | Status: DC

## 2022-01-15 MED ORDER — FLUTICASONE PROPIONATE 50 MCG/ACT NA SUSP: 1.0000 | Freq: Two times a day (BID) | NASAL | 2 refills | Status: DC

## 2022-01-15 MED ORDER — PROMETHAZINE-DM 6.25-15 MG/5ML PO SYRP: 5.0000 mL | ORAL_SOLUTION | Freq: Four times a day (QID) | ORAL | 0 refills | Status: DC | PRN

## 2022-01-15 NOTE — ED Triage Notes (Signed)
Pt states fever,cough, runny nose, body aches for the past 2 months.  States he saw his primary care doctor and has taken 2 different antibiotics but as soon as he finishes them the symptoms come back.

## 2022-01-15 NOTE — ED Provider Notes (Signed)
[facility]    CSN: [id number] Arrival date & time: 01/15/22  0830      History   Chief Complaint Chief Complaint  Patient presents with   Fever    HPI KAILON TREESE is a 56 y.o. male.   Patient presenting today with 2 to 32-monthhistory of daily nasal congestion, productive cough.  States he occasionally will have a low-grade fever here and there, has not had a fever for over a week now.  States his primary care provider has put him on 2 rounds of antibiotics with no lasting relief.  He is taking the occasional Alka-Seltzer cold and sinus but otherwise not trying anything over-the-counter for symptoms.  No known chronic pulmonary disease or history of seasonal allergies.  No known sick contacts.    Past Medical History:  Diagnosis Date   Hypertension     Patient Active Problem List   Diagnosis Date Noted   Special screening for malignant neoplasms, colon 07/07/2017    Past Surgical History:  Procedure Laterality Date   COLONOSCOPY N/A 09/28/2017   Procedure: COLONOSCOPY;  Surgeon: RRogene Houston MD;  [facility];  Service: Endoscopy;  Laterality: N/A;  730   POLYPECTOMY  09/28/2017   Procedure: POLYPECTOMY;  Surgeon: RRogene Houston MD;  [facility];  Service: Endoscopy;;  colon       Home Medications    Prior to Admission medications   Medication Sig Start Date End Date Taking? Authorizing Provider  amLODipine (NORVASC) 5 MG tablet Take 5 mg by mouth daily. 12/30/21  Yes [provider]  cetirizine (ZYRTEC ALLERGY) 10 MG tablet Take 1 tablet (10 mg total) by mouth daily. 01/15/22  Yes LVolney American PA-C  fluticasone ([facility] 50 MCG/ACT nasal spray Place 1 spray into both nostrils 2 (two) times daily. 01/15/22  Yes LVolney American PA-C  predniSONE (DELTASONE) 20 MG tablet Take 2 tablets (40 mg total) by mouth daily with breakfast. 01/15/22  Yes LVolney American PA-C   promethazine-dextromethorphan (PROMETHAZINE-DM) 6.25-15 MG/5ML syrup Take 5 mLs by mouth 4 (four) times daily as needed. 01/15/22  Yes LVolney American PA-C  traZODone (DESYREL) 50 MG tablet Take 50 mg by mouth at bedtime. 12/27/21  Yes [provider]  aspirin 325 MG EC tablet Take 650 mg by mouth daily as needed for pain.     [provider]  losartan (COZAAR) 50 MG tablet Take 50 mg by mouth daily. 08/03/17   [provider]    Family History History reviewed. No pertinent family history.  Social History Social History   Tobacco Use   Smoking status: Former   Smokeless tobacco: Former  Substance Use Topics   Alcohol use: Yes    Comment: occ   Drug use: No     Allergies   Patient has no known allergies.   Review of Systems Review of Systems HPI  Physical Exam Triage Vital Signs ED Triage Vitals  Enc Vitals Group     BP 01/15/22 0952 (!) 153/89     Pulse Rate 01/15/22 0952 96     Resp 01/15/22 0952 16     Temp 01/15/22 0952 98.8 F (37.1 C)     Temp Source 01/15/22 0952 Oral     SpO2 01/15/22 0952 96 %     Weight --      Height --      Head Circumference --      Peak Flow --  Pain Score 01/15/22 0953 0     Pain Loc --      Pain Edu? --      Excl. in [place]? --    No data found.  Updated Vital Signs BP (!) 153/89 (BP Location: Right Arm)   Pulse 96   Temp 98.8 F (37.1 C) (Oral)   Resp 16   SpO2 96%   Visual Acuity Right Eye Distance:   Left Eye Distance:   Bilateral Distance:    Right Eye Near:   Left Eye Near:    Bilateral Near:     Physical Exam Vitals and nursing note reviewed.  Constitutional:      Appearance: He is well-developed.  HENT:     Head: Atraumatic.     Right Ear: External ear normal.     Left Ear: External ear normal.     Nose: Congestion present.     Mouth/Throat:     Mouth: Mucous membranes are moist.     Pharynx: No oropharyngeal exudate or posterior oropharyngeal erythema.  Eyes:      Conjunctiva/sclera: Conjunctivae normal.     Pupils: Pupils are equal, round, and reactive to light.  Cardiovascular:     Rate and Rhythm: Normal rate and regular rhythm.  Pulmonary:     Effort: Pulmonary effort is normal. No respiratory distress.     Breath sounds: No wheezing or rales.  Musculoskeletal:        General: Normal range of motion.     Cervical back: Normal range of motion and neck supple.  Lymphadenopathy:     Cervical: No cervical adenopathy.  Skin:    General: Skin is warm and dry.  Neurological:     Mental Status: He is alert and oriented to person, place, and time.     Motor: No weakness.     Gait: Gait normal.  Psychiatric:        Behavior: Behavior normal.     UC Treatments / Results  Labs (all labs ordered are listed, but only abnormal results are displayed) Labs Reviewed - No data to display  EKG   Radiology No results found.  Procedures Procedures (including critical care time)  Medications Ordered in UC Medications - No data to display  Initial Impression / Assessment and Plan / UC Course  I have reviewed the triage vital signs and the nursing notes.  Pertinent labs & imaging results that were available during my care of the patient were reviewed by me and considered in my medical decision making (see chart for details).     Very low suspicion for a bacterial infection at this time, given duration and unchanging course suspect an inflammatory/allergic cause.  His vital signs and exam are very reassuring today, no evidence of pneumonia or bronchitis.  Suspect some uncontrolled seasonal allergy component.  Will treat with prednisone to help initial flare symptoms and start a consistent regimen of Zyrtec and Flonase.  Cough syrup given additionally and discussed supportive over-the-counter remedies while getting symptoms back under control.  Final Clinical Impressions(s) / UC Diagnoses   Final diagnoses:  Chronic nasal congestion  Chronic  cough   Discharge Instructions   None    ED Prescriptions     Medication Sig Dispense Auth. Provider   predniSONE (DELTASONE) 20 MG tablet Take 2 tablets (40 mg total) by mouth daily with breakfast. 10 tablet Volney American, PA-C   fluticasone [facility]) 50 MCG/ACT nasal spray Place 1 spray into both nostrils 2 (two) times  daily. 16 g Volney American, [place]   cetirizine (ZYRTEC ALLERGY) 10 MG tablet Take 1 tablet (10 mg total) by mouth daily. 30 tablet Volney American, [place]   promethazine-dextromethorphan (PROMETHAZINE-DM) 6.25-15 MG/5ML syrup Take 5 mLs by mouth 4 (four) times daily as needed. 100 mL Volney American, [place]      PDMP not reviewed this encounter.   Volney American, [place] 01/15/22 1045

## 2022-03-02 DIAGNOSIS — Z125 Encounter for screening for malignant neoplasm of prostate: Secondary | ICD-10-CM | POA: Diagnosis not present

## 2022-03-02 DIAGNOSIS — G4700 Insomnia, unspecified: Secondary | ICD-10-CM | POA: Diagnosis not present

## 2022-03-02 DIAGNOSIS — I10 Essential (primary) hypertension: Secondary | ICD-10-CM | POA: Diagnosis not present

## 2022-03-02 DIAGNOSIS — Z79899 Other long term (current) drug therapy: Secondary | ICD-10-CM | POA: Diagnosis not present

## 2022-03-09 DIAGNOSIS — I10 Essential (primary) hypertension: Secondary | ICD-10-CM | POA: Diagnosis not present

## 2022-03-09 DIAGNOSIS — R7309 Other abnormal glucose: Secondary | ICD-10-CM | POA: Diagnosis not present

## 2022-03-23 ENCOUNTER — Ambulatory Visit (INDEPENDENT_AMBULATORY_CARE_PROVIDER_SITE_OTHER): Payer: 59 | Admitting: Urology

## 2022-03-23 ENCOUNTER — Encounter: Payer: Self-pay | Admitting: Urology

## 2022-03-23 VITALS — BP 123/84 | HR 70

## 2022-03-23 DIAGNOSIS — R350 Frequency of micturition: Secondary | ICD-10-CM | POA: Diagnosis not present

## 2022-03-23 DIAGNOSIS — R972 Elevated prostate specific antigen [PSA]: Secondary | ICD-10-CM

## 2022-03-23 LAB — MICROSCOPIC EXAMINATION: Bacteria, UA: NONE SEEN

## 2022-03-23 LAB — URINALYSIS, ROUTINE W REFLEX MICROSCOPIC
Bilirubin, UA: NEGATIVE
Glucose, UA: NEGATIVE
Ketones, UA: NEGATIVE
Nitrite, UA: NEGATIVE
Protein,UA: NEGATIVE
RBC, UA: NEGATIVE
Specific Gravity, UA: 1.02 (ref 1.005–1.030)
Urobilinogen, Ur: 0.2 mg/dL (ref 0.2–1.0)
pH, UA: 6.5 (ref 5.0–7.5)

## 2022-03-23 LAB — BLADDER SCAN AMB NON-IMAGING: Scan Result: 12

## 2022-03-23 MED ORDER — LEVOFLOXACIN 750 MG PO TABS: 750.0000 mg | ORAL_TABLET | Freq: Once | ORAL | 0 refills | Status: AC

## 2022-03-23 NOTE — Patient Instructions (Addendum)
Appointment Time:12:30 please arrive by 12:15pm Appointment Date: 05/04/22  Location: [facility] Radiology Department   Prostate Biopsy Instructions  Stop all aspirin or blood thinners (aspirin, plavix, coumadin, warfarin, motrin, ibuprofen, advil, aleve, naproxen, naprosyn) for 7 days prior to the procedure.  If you have any questions about stopping these medications, please contact your primary care physician or cardiologist.  Having a light meal prior to the procedure is recommended.  If you are diabetic or have low blood sugar please bring a small snack or glucose tablet.  A Fleets enema is needed to be purchased over the counter at a local pharmacy and used 2 hours before you scheduled appointment.  This can be purchased over the counter at any pharmacy.  Antibiotics will be administered in the clinic at the time of the procedure and 1 tablet has been sent to your pharmacy. Please take the antibiotic as prescribed.    Please bring someone with you to the procedure to drive you home if you are given a valium to take prior to your procedure.   If you have any questions or concerns, please feel free to call the office at [phone] or send a Mychart message.    Thank you, [facility]    This procedure is usually done to evaluate the prostate gland of men who have raised (elevated) levels of prostate-specific antigen (PSA), which can be a sign of prostate cancer or prostate enlargement related to aging (benign prostatic hyperplasia, or BPH). Tell a health care provider about: Any allergies you have. All medicines you are taking, including vitamins, herbs, eye drops, creams, and over-the-counter medicines. Any problems you or family members have had with anesthetic medicines. Any bleeding problems you have. Any surgeries you have had. Any medical conditions you have. Any prostate infections you have had. What are the risks? Generally, this is a safe procedure.  However, problems may occur, including: Prostate infection. Bleeding from the rectum. Blood in the urine. Allergic reactions to medicines. Damage to surrounding structures such as blood vessels, organs, or muscles. Difficulty passing urine. Nerve damage. This is usually temporary. What happens before the procedure? Medicines Ask your health care provider about: Changing or stopping your regular medicines. This is especially important if you are taking diabetes medicines or blood thinners. Taking medicines such as aspirin and ibuprofen. These medicines can thin your blood. Do not take these medicines unless your health care provider tells you to take them. Taking over-the-counter medicines, vitamins, herbs, and supplements. General instructions Follow instructions from your health care provider about eating and drinking. In most instances, you will not need to stop eating and drinking completely before the procedure. You will be given an enema. During an enema, a liquid is injected into your rectum to clear out waste. You may have a blood or urine sample taken. Ask your health care provider what steps will be taken to help prevent infection. These steps may include: Washing skin with a germ-killing soap. Taking antibiotic medicine. If you will be going home right after the procedure, plan to have a responsible adult: Take you home from the hospital or clinic. You will not be allowed to drive. Care for you for the time you are told. What happens during the procedure?  An IV will be inserted into one of your veins. You will be given one or both of the following: A medicine to help you relax (sedative). A medicine to numb the area (local anesthetic). You will be placed  on your left side, and your knees will be bent toward your chest. A probe with lubricated gel will be placed into your rectum, and images will be taken of your prostate and surrounding structures. Numbing medicine will be  injected into your prostate. A biopsy needle will be inserted through your rectum or perineum and guided to your prostate using the ultrasound images. Prostate tissue samples will be removed, and the needle and probe will then be removed. The biopsy samples will be sent to a lab to be tested. The procedure may vary among health care providers and hospitals. What happens after the procedure? Your blood pressure, heart rate, breathing rate, and blood oxygen level will be monitored until you leave the hospital or clinic. You may have some discomfort in the rectal area. You will be given pain medicine as needed. If you were given a sedative during the procedure, it can affect you for several hours. Do not drive or operate machinery until your health care provider says that it is safe. It is up to you to get the results of your procedure. Ask your health care provider, or the department that is doing the procedure, when your results will be ready. Keep all follow-up visits. This is important. Summary A transrectal ultrasound-guided biopsy removes samples of tissue from your prostate using ultrasound-guided sound waves to help guide the process. This procedure is usually done to evaluate the prostate gland of men who have raised (elevated) levels of prostate-specific antigen (PSA), which can be a sign of prostate cancer or prostate enlargement related to aging. After your procedure, you may feel some discomfort in the rectal area. Plan to have a responsible adult take you home from the hospital or clinic, and follow up with your health care provider for your results. This information is not intended to replace advice given to you by your health care provider. Make sure you discuss any questions you have with your health care provider. Document Revised: 06/29/2020 Document Reviewed: 06/29/2020 Elsevier Patient Education  [place].

## 2022-03-23 NOTE — Addendum Note (Signed)
Addended by: Iris Pert on: 03/23/2022 10:42 AM   Modules accepted: Orders

## 2022-03-23 NOTE — Progress Notes (Signed)
Pt here today for bladder scan. Bladder was scanned and 12 was visualized.    Performed by Marisue Brooklyn, CMA

## 2022-03-23 NOTE — Progress Notes (Signed)
03/23/2022 9:39 AM   Clydia Llano 08/29/1965 [id number]  Referring provider: Asencion Noble, MD [address] [place],  [place] [address]  Elevated PSA  HPI: Mr Forgacs is a 57yo here for evaluation of elevated PSA. PSA 4.0 this year up from 2.5 the prior year. IPSS 9 QOL 2. Nocturia 1-2x. Urinary frequency every 3 hours. No family hx of prostate cancer.   PMH: Past Medical History:  Diagnosis Date   Hypertension     Surgical History: Past Surgical History:  Procedure Laterality Date   COLONOSCOPY N/A 09/28/2017   Procedure: COLONOSCOPY;  Surgeon: Rogene Houston, MD;  [facility];  Service: Endoscopy;  Laterality: N/A;  730   POLYPECTOMY  09/28/2017   Procedure: POLYPECTOMY;  Surgeon: Rogene Houston, MD;  [facility];  Service: Endoscopy;;  colon    Home Medications:  Allergies as of 03/23/2022   No Known Allergies      Medication List        Accurate as of March 23, 2022  9:39 AM. If you have any questions, ask your nurse or doctor.          amLODipine 5 MG tablet Commonly known as: NORVASC Take 5 mg by mouth daily.   aspirin EC 325 MG tablet Take 650 mg by mouth daily as needed for pain.   benzonatate 100 MG capsule Commonly known as: TESSALON Take 1 capsule (100 mg total) by mouth 2 (two) times daily as needed for cough.   cetirizine 10 MG tablet Commonly known as: ZyrTEC Allergy Take 1 tablet (10 mg total) by mouth daily.   fluticasone 50 MCG/ACT nasal spray Commonly known as: FLONASE Place 1 spray into both nostrils 2 (two) times daily.   losartan 50 MG tablet Commonly known as: COZAAR Take 50 mg by mouth daily.   predniSONE 10 MG (21) Tbpk tablet Commonly known as: STERAPRED UNI-PAK 21 TAB Take as directed   predniSONE 20 MG tablet Commonly known as: DELTASONE Take 2 tablets (40 mg total) by mouth daily with breakfast.   promethazine-dextromethorphan 6.25-15 MG/5ML syrup Commonly known as:  PROMETHAZINE-DM Take 5 mLs by mouth 4 (four) times daily as needed.   traZODone 50 MG tablet Commonly known as: DESYREL Take 50 mg by mouth at bedtime.        Allergies: No Known Allergies  Family History: No family history on file.  Social History:  reports that he has quit smoking. He has quit using smokeless tobacco. He reports current alcohol use. He reports that he does not use drugs.  ROS: All other review of systems were reviewed and are negative except what is noted above in HPI  Physical Exam: BP 123/84   Pulse 70   Constitutional:  Alert and oriented, No acute distress. HEENT: [place] AT, moist mucus membranes.  Trachea midline, no masses. Cardiovascular: No clubbing, cyanosis, or edema. Respiratory: Normal respiratory effort, no increased work of breathing. GI: Abdomen is soft, nontender, nondistended, no abdominal masses GU: No CVA tenderness. Circumcised phallus. No masses/lesions on penis, testis, scrotum. Prostate 40g smooth. Right lobe indurated.  Lymph: No cervical or inguinal lymphadenopathy. Skin: No rashes, bruises or suspicious lesions. Neurologic: Grossly intact, no focal deficits, moving all 4 extremities. Psychiatric: Normal mood and affect.  Laboratory Data: No results found for: "WBC", "HGB", "HCT", "MCV", "PLT"  No results found for: "CREATININE"  No results found for: "PSA"  No results found for: "TESTOSTERONE"  No results found for: "HGBA1C"  Urinalysis No results found for: "COLORURINE", "APPEARANCEUR", "LABSPEC", "PHURINE", "GLUCOSEU", "HGBUR", "BILIRUBINUR", "KETONESUR", "PROTEINUR", "UROBILINOGEN", "NITRITE", "LEUKOCYTESUR"  No results found for: "LABMICR", "WBCUA", "RBCUA", "LABEPIT", "MUCUS", "BACTERIA"  Pertinent Imaging:  No results found for this or any previous visit.  No results found for this or any previous visit.  No results found for this or any previous visit.  No results found for this or any previous visit.  No  results found for this or any previous visit.  No valid procedures specified. No results found for this or any previous visit.  No results found for this or any previous visit.   Assessment & Plan:    1. Elevated PSA The patient and I talked about etiologies of elevated PSA.  We discussed the possible relationship between elevated PSA, prostate cancer, BPH, prostatitis, and UTI.   Conservative treatment of elevated PSA with watchful waiting was discussed with the patient.  All questions were answered.        All of the risks and benefits along with alternatives to prostate biopsy were discussed with the patient.  The patient gave fully informed consent to proceed with a transrectal ultrasound guided biopsy of the prostate for the evaluation of their evated PSA.  Prostate biopsy instructions and antibiotics were given to the patient.  - Urinalysis, Routine w reflex microscopic     No follow-ups on file.  Nicolette Bang, MD  [facility] [place]

## 2022-04-12 ENCOUNTER — Ambulatory Visit: Payer: 59 | Admitting: Urology

## 2022-05-04 ENCOUNTER — Encounter: Payer: Self-pay | Admitting: Urology

## 2022-05-04 ENCOUNTER — Other Ambulatory Visit: Payer: Self-pay | Admitting: Urology

## 2022-05-04 ENCOUNTER — Ambulatory Visit (HOSPITAL_BASED_OUTPATIENT_CLINIC_OR_DEPARTMENT_OTHER): Payer: 59 | Admitting: Urology

## 2022-05-04 ENCOUNTER — Ambulatory Visit (HOSPITAL_COMMUNITY)
Admission: RE | Admit: 2022-05-04 | Discharge: 2022-05-04 | Disposition: A | Discharge status: Home / Self Care | Payer: 59 | Source: Ambulatory Visit | Attending: Urology | Admitting: Urology

## 2022-05-04 ENCOUNTER — Encounter (HOSPITAL_COMMUNITY): Payer: Self-pay

## 2022-05-04 VITALS — BP 150/86 | HR 79 | Temp 98.0°F | Resp 18

## 2022-05-04 DIAGNOSIS — C61 Malignant neoplasm of prostate: Secondary | ICD-10-CM

## 2022-05-04 DIAGNOSIS — R350 Frequency of micturition: Secondary | ICD-10-CM

## 2022-05-04 DIAGNOSIS — R972 Elevated prostate specific antigen [PSA]: Secondary | ICD-10-CM

## 2022-05-04 MED ORDER — GENTAMICIN SULFATE 40 MG/ML IJ SOLN
INTRAMUSCULAR | Status: AC
  Filled 2022-05-04: qty 2

## 2022-05-04 MED ORDER — GENTAMICIN SULFATE 40 MG/ML IJ SOLN
80.0000 mg | Freq: Once | INTRAMUSCULAR | Status: AC
  Administered 2022-05-04: 80 mg via INTRAMUSCULAR

## 2022-05-04 MED ORDER — LIDOCAINE HCL (PF) 2 % IJ SOLN
10.0000 mL | Freq: Once | INTRAMUSCULAR | Status: AC
  Administered 2022-05-04: 10 mL

## 2022-05-04 MED ORDER — LIDOCAINE HCL (PF) 2 % IJ SOLN
INTRAMUSCULAR | Status: AC
  Filled 2022-05-04: qty 10

## 2022-05-04 NOTE — Patient Instructions (Signed)
Transrectal Ultrasound-Guided Prostate Biopsy, Care After What can I expect after the procedure? After the procedure, it is common to have: Pain and discomfort near your butt (rectum), especially while sitting. Pink-colored pee (urine). This is due to small amounts of blood in your pee. A burning feeling while peeing. Blood in your poop (stool). Bleeding from your butt. Blood in your semen. Follow these instructions at home: Medicines Take over-the-counter and prescription medicines only as told by your doctor. If you were given a sedative during your procedure, do not drive or use machines until your doctor says that it is safe. A sedative is a medicine that helps you relax. If you were prescribed an antibiotic medicine, take it as told by your doctor. Do not stop taking it even if you start to feel better. Activity  Return to your normal activities when your doctor says that it is safe. Ask your doctor when it is okay for you to have sex. You may have to avoid lifting. Ask your doctor how much you can safely lift. General instructions  Drink enough water to keep your pee pale yellow. Watch your pee, poop, and semen for new bleeding or bleeding that gets worse. Keep all follow-up visits. Contact a doctor if: You have any of these: Blood clots in your pee or poop. Blood in your pee more than 2 weeks after the procedure. Blood in your semen more than 2 months after the procedure. New or worse bleeding in your pee, poop, or semen. Very bad belly pain. Your pee smells bad or unusual. You have trouble peeing. Your lower belly feels firm. You have problems getting an erection. You feel like you may vomit (are nauseous), or you vomit. Get help right away if: You have a fever or chills. You have bright red pee. You have very bad pain that does not get better with medicine. You cannot pee. Summary After this procedure, it is common to have pain and discomfort near your butt,  especially while sitting. You may have blood in your pee and poop. It is common to have blood in your semen. Get help right away if you have a fever or chills. This information is not intended to replace advice given to you by your health care provider. Make sure you discuss any questions you have with your health care provider. Document Revised: 06/29/2020 Document Reviewed: 06/29/2020 Elsevier Patient Education  2023 [employer]  

## 2022-05-04 NOTE — Progress Notes (Signed)
Prostate Biopsy Procedure   Informed consent was obtained after discussing risks/benefits of the procedure.  A time out was performed to ensure correct patient identity.  Pre-Procedure: - Last PSA Level: No results found for: "PSA" - Gentamicin given prophylactically - Levaquin 500 mg administered PO -Transrectal Ultrasound performed revealing a 43.9 gm prostate -No significant hypoechoic or median lobe noted  Procedure: - Prostate block performed using 10 cc 1% lidocaine and biopsies taken from sextant areas, a total of 12 under ultrasound guidance.  Post-Procedure: - Patient tolerated the procedure well - He was counseled to seek immediate medical attention if experiences any severe pain, significant bleeding, or fevers - Return in one week to discuss biopsy results

## 2022-05-13 ENCOUNTER — Ambulatory Visit (INDEPENDENT_AMBULATORY_CARE_PROVIDER_SITE_OTHER): Payer: 59 | Admitting: Urology

## 2022-05-13 ENCOUNTER — Encounter: Payer: Self-pay | Admitting: Urology

## 2022-05-13 DIAGNOSIS — R972 Elevated prostate specific antigen [PSA]: Secondary | ICD-10-CM | POA: Diagnosis not present

## 2022-05-13 DIAGNOSIS — C61 Malignant neoplasm of prostate: Secondary | ICD-10-CM

## 2022-05-13 NOTE — Patient Instructions (Signed)
Prostate Cancer  The prostate is a small gland that produces fluid that makes up semen (seminal fluid). It is located below the bladder in men, in front of the rectum. Prostate cancer is the abnormal growth of cells in the prostate gland. What are the causes? The exact cause of this condition is not known. What increases the risk? You are more likely to develop this condition if: You are 58 years of age or older. You have a family history of prostate cancer. You have a family history of breast and ovarian cancer. You have genes that are passed from parent to child (inherited), such as BRCA1 and BRCA2. You have Lynch syndrome. African American men and men of African descent are diagnosed with prostate cancer at higher rates than other men. The reasons for this are not well understood and are likely due to a combination of genetic and environmental factors. What are the signs or symptoms? Symptoms of this condition include: Problems with urination. This may include: A weak or interrupted flow of urine. Trouble starting or stopping urination. Trouble emptying the bladder all the way. The need to urinate more often, especially at night. Blood in urine or semen. Persistent pain or discomfort in the lower back, lower abdomen, or hips. Trouble getting an erection. Weakness or numbness in the legs or feet. How is this diagnosed? This condition can be diagnosed with: A digital rectal exam. For this exam, a health care provider inserts a gloved finger into the rectum to feel the prostate gland. A blood test called a prostate-specific antigen (PSA) test. A procedure in which a sample of tissue is taken from the prostate and checked under a microscope (prostate biopsy). An imaging test called transrectal ultrasonography. Once the condition is diagnosed, tests will be done to determine how far the cancer has spread. This is called staging the cancer. Staging may involve imaging tests, such as a bone  scan, CT scan, PET scan, or MRI. Stages of prostate cancer The stages of prostate cancer are as follows: Stage 1 (I). At this stage, the cancer is found in the prostate only. The cancer is not visible on imaging tests, and it is usually found by accident, such as during prostate surgery. Stage 2 (II). At this stage, the cancer is more advanced than it is in stage 1, but the cancer has not spread outside the prostate. Stage 3 (III). At this stage, the cancer has spread beyond the outer layer of the prostate to nearby tissues. The cancer may be found in the seminal vesicles, which are near the bladder and the prostate. Stage 4 (IV). At this stage, the cancer has spread to other parts of the body, such as the lymph nodes, bones, bladder, rectum, liver, or lungs. Prostate cancer grading Prostate cancer is also graded according to how the cancer cells look under a microscope. This is called the Gleason score and the total score can range from 6-10, indicating how likely it is that the cancer will spread (metastasize) to other parts of the body. The higher the score, the greater the likelihood that the cancer will spread. Gleason 6 or lower: This indicates that the cancer cells look similar to normal prostate cells (well differentiated). Gleason 7: This indicates that the cancer cells look somewhat similar to normal prostate cells (moderately differentiated). Gleason 8, 9, or 10: This indicates that the cancer cells look very different than normal prostate cells (poorly differentiated). How is this treated? Treatment for this condition depends on several   factors, including the stage of the cancer, your age, personal preferences, and your overall health. Talk with your health care provider about treatment options that are recommended for you. Common treatments include: Observation for early stage prostate cancer (active surveillance). This involves having exams, blood tests, and in some cases, more biopsies.  For some men, this is the only treatment needed. Surgery. Types of surgeries include: Open surgery (radical prostatectomy). In this surgery, a larger incision is made to remove the prostate. A laparoscopic radical prostatectomy. This is a surgery to remove the prostate and lymph nodes through several small incisions. It is often referred to as a minimally invasive surgery. A robotic radical prostatectomy. This is laparoscopic surgery to remove the prostate and lymph nodes with the help of robotic arms that are controlled by the surgeon. Cryoablation. This is surgery to freeze and destroy cancer cells. Radiation treatment. Types of radiation treatment include: External beam radiation. This type aims beams of radiation from outside the body at the prostate to destroy cancerous cells. Brachytherapy. This type uses radioactive needles, seeds, wires, or tubes that are implanted into the prostate gland. Like external beam radiation, brachytherapy destroys cancerous cells. An advantage is that this type of radiation limits the damage to surrounding tissue and has fewer side effects. Chemotherapy. This treatment kills cancer cells or stops them from multiplying. It kills both cancer cells and normal cells. Targeted therapy. This treatment uses medicines to kill cancer cells without damaging normal cells. Hormone treatment. This treatment involves taking medicines that act on testosterone, one of the male hormones, by: Stopping your body from producing testosterone. Blocking testosterone from reaching cancer cells. Follow these instructions at home: Lifestyle Do not use any products that contain nicotine or tobacco. These products include cigarettes, chewing tobacco, and vaping devices, such as e-cigarettes. If you need help quitting, ask your health care provider. Eat a healthy diet. To do this: Eat foods that are high in fiber. These include beans, whole grains, and fresh fruits and vegetables. Limit  foods that are high in fat and sugar. These include fried or sweet foods. Treatment for prostate cancer may affect sexual function. If you have a partner, continue to have intimate moments. This may include touching, holding, hugging, and caressing your partner. Get plenty of sleep. Consider joining a support group for men who have prostate cancer. Meeting with a support group may help you learn to manage the stress of having cancer. General instructions Take over-the-counter and prescription medicines only as told by your health care provider. If you have to go to the hospital, notify your cancer specialist (oncologist). Keep all follow-up visits. This is important. Where to find more information American Cancer Society: www.cancer.org American Society of Clinical Oncology: www.cancer.net National Cancer Institute: www.cancer.gov Contact a health care provider if: You have new or increasing trouble urinating. You have new or increasing blood in your urine. You have new or increasing pain in your hips, back, or chest. Get help right away if: You have weakness or numbness in your legs. You cannot control urination or your bowel movements (incontinence). You have chills or a fever. Summary The prostate is a small gland that is involved in the production of semen. It is located below a man's bladder, in front of the rectum. Prostate cancer is the abnormal growth of cells in the prostate gland. Treatment for this condition depends on the stage of the cancer, your age, personal preferences, and your overall health. Talk with your health care provider about   treatment options that are recommended for you. Consider joining a support group for men who have prostate cancer. Meeting with a support group may help you learn to manage the stress of having cancer. This information is not intended to replace advice given to you by your health care provider. Make sure you discuss any questions you have with  your health care provider. Document Revised: 04/01/2020 Document Reviewed: 04/01/2020 Elsevier Patient Education  2023 Elsevier Inc.  

## 2022-05-13 NOTE — Progress Notes (Signed)
05/13/2022 1:22 PM   Brandon Avila 1965-11-04 [id number]  Referring provider: Carylon Perches, MD [address] [place],  [place] [address]  Followup prostate biopsy  HPI: Mr Quillin is a 56yo here for followup after prostate biopsy. Biopsy revealed Gleason 3+4=7 in 3/12 cores and Gleason 3+3=6 in 3/12 cores. PSA 4.0. He has mild LUTS.    PMH: Past Medical History:  Diagnosis Date   Hypertension     Surgical History: Past Surgical History:  Procedure Laterality Date   COLONOSCOPY N/A 09/28/2017   Procedure: COLONOSCOPY;  Surgeon: Malissa Hippo, MD;  [facility];  Service: Endoscopy;  Laterality: N/A;  730   POLYPECTOMY  09/28/2017   Procedure: POLYPECTOMY;  Surgeon: Malissa Hippo, MD;  [facility];  Service: Endoscopy;;  colon    Home Medications:  Allergies as of 05/13/2022   No Known Allergies      Medication List        Accurate as of May 13, 2022  1:22 PM. If you have any questions, ask your nurse or doctor.          amLODipine 5 MG tablet Commonly known as: NORVASC Take 5 mg by mouth daily.   aspirin EC 325 MG tablet Take 650 mg by mouth daily as needed for pain.   benzonatate 100 MG capsule Commonly known as: TESSALON Take 1 capsule (100 mg total) by mouth 2 (two) times daily as needed for cough.   cetirizine 10 MG tablet Commonly known as: ZyrTEC Allergy Take 1 tablet (10 mg total) by mouth daily.   fluticasone 50 MCG/ACT nasal spray Commonly known as: FLONASE Place 1 spray into both nostrils 2 (two) times daily.   losartan 50 MG tablet Commonly known as: COZAAR Take 50 mg by mouth daily.   predniSONE 10 MG (21) Tbpk tablet Commonly known as: STERAPRED UNI-PAK 21 TAB Take as directed   predniSONE 20 MG tablet Commonly known as: DELTASONE Take 2 tablets (40 mg total) by mouth daily with breakfast.   promethazine-dextromethorphan 6.25-15 MG/5ML syrup Commonly known as: PROMETHAZINE-DM Take 5 mLs by  mouth 4 (four) times daily as needed.   traZODone 50 MG tablet Commonly known as: DESYREL Take 50 mg by mouth at bedtime.        Allergies: No Known Allergies  Family History: No family history on file.  Social History:  reports that he has quit smoking. He has quit using smokeless tobacco. He reports current alcohol use. He reports that he does not use drugs.  ROS: All other review of systems were reviewed and are negative except what is noted above in HPI  Physical Exam: There were no vitals taken for this visit.  Constitutional:  Alert and oriented, No acute distress. HEENT: [place] AT, moist mucus membranes.  Trachea midline, no masses. Cardiovascular: No clubbing, cyanosis, or edema. Respiratory: Normal respiratory effort, no increased work of breathing. GI: Abdomen is soft, nontender, nondistended, no abdominal masses GU: No CVA tenderness.  Lymph: No cervical or inguinal lymphadenopathy. Skin: No rashes, bruises or suspicious lesions. Neurologic: Grossly intact, no focal deficits, moving all 4 extremities. Psychiatric: Normal mood and affect.  Laboratory Data: No results found for: "WBC", "HGB", "HCT", "MCV", "PLT"  No results found for: "CREATININE"  No results found for: "PSA"  No results found for: "TESTOSTERONE"  No results found for: "HGBA1C"  Urinalysis    Component Value Date/Time   APPEARANCEUR Clear 03/23/2022 0904   GLUCOSEU Negative 03/23/2022 0904  BILIRUBINUR Negative 03/23/2022 0904   PROTEINUR Negative 03/23/2022 0904   NITRITE Negative 03/23/2022 0904   LEUKOCYTESUR Trace (A) 03/23/2022 0904    Lab Results  Component Value Date   LABMICR See below: 03/23/2022   WBCUA 6-10 (A) 03/23/2022   LABEPIT 0-10 03/23/2022   BACTERIA None seen 03/23/2022    Pertinent Imaging:  No results found for this or any previous visit.  No results found for this or any previous visit.  No results found for this or any previous visit.  No results  found for this or any previous visit.  No results found for this or any previous visit.  No valid procedures specified. No results found for this or any previous visit.  No results found for this or any previous visit.   Assessment & Plan:    1. . Prostate cancer (HCC) I discussed the natural history of favorable intermediate risk prostate cancer with the patient and the various treatment options including active surveillance, RALP, IMRT, brachytherapy, cryotherapy, HIFU and ADT. After discussing the options the patient elects for radical prostatectomy verus brachytherapy. I will refer to Dr. Berneice Heinrich for consideration of RALP and Dr. Kathrynn Running for consideration of brachytherapy    No follow-ups on file.  Wilkie Aye, MD  [facility] [place]

## 2022-05-20 ENCOUNTER — Telehealth: Payer: Self-pay | Admitting: Radiation Oncology

## 2022-05-20 NOTE — Telephone Encounter (Signed)
Spoke with pt after receiving secure chat about pt's appt. Pt requested temporary cancellation of appt. Pt unsure if he wants to proceed with appt. Pt asked [place] to f/u 5/9 to see what he decides. Will contact on 5/9.

## 2022-05-24 ENCOUNTER — Ambulatory Visit: Payer: 59

## 2022-05-24 ENCOUNTER — Ambulatory Visit: Payer: 59 | Admitting: Radiation Oncology

## 2022-05-26 ENCOUNTER — Telehealth: Payer: Self-pay | Admitting: Radiation Oncology

## 2022-05-26 NOTE — Telephone Encounter (Addendum)
Called patient to schedule a consultation w. Dr. Kathrynn Running. Patient has decided to get Surgery instead of TX. Closing referral until further notice. LVM to notify Dr. Dimas Millin office.

## 2022-06-01 ENCOUNTER — Telehealth: Payer: Self-pay

## 2022-06-01 NOTE — Telephone Encounter (Signed)
Patient called office to let Dr. Ronne Binning know he does not wish to proceed with brachytherapy. He did not schedule a visit with Dr. Kathrynn Running.  Patient does wish to see Dr. Berneice Heinrich but has not heard from [facility] to have appointment scheduled.

## 2022-06-02 NOTE — Telephone Encounter (Signed)
I spoke with Junious Dresser at [facility]- referral not received.  Referral documentation faxed to [phone].   Returned patient call to notify referral sent and should hear from [facility] for scheduling. Patient voiced understanding.

## 2022-06-15 ENCOUNTER — Encounter: Payer: Self-pay | Admitting: Urology

## 2022-06-15 ENCOUNTER — Ambulatory Visit (INDEPENDENT_AMBULATORY_CARE_PROVIDER_SITE_OTHER): Payer: 59 | Admitting: Urology

## 2022-06-15 VITALS — BP 179/96 | HR 103

## 2022-06-15 DIAGNOSIS — C61 Malignant neoplasm of prostate: Secondary | ICD-10-CM | POA: Diagnosis not present

## 2022-06-15 NOTE — Progress Notes (Signed)
06/15/2022 2:54 PM   Brandon Avila 12/06/1965 [id number]  Referring provider: Carylon Perches, MD [address] [place],  [place] [address]  Chief Complaint  Patient presents with   Prostate Cancer    HPI: Brandon Avila is a 56yo here for followup for prostate cancer. He has met with Dr. Kathrynn Running and Dr. Berneice Heinrich and has elected to proceed with prostatectomy. He has mild LUTS. He denies significant erectile dysfunction   PMH: Past Medical History:  Diagnosis Date   Hypertension     Surgical History: Past Surgical History:  Procedure Laterality Date   COLONOSCOPY N/A 09/28/2017   Procedure: COLONOSCOPY;  Surgeon: Malissa Hippo, MD;  [facility];  Service: Endoscopy;  Laterality: N/A;  730   POLYPECTOMY  09/28/2017   Procedure: POLYPECTOMY;  Surgeon: Malissa Hippo, MD;  [facility];  Service: Endoscopy;;  colon    Home Medications:  Allergies as of 06/15/2022   No Known Allergies      Medication List        Accurate as of Jun 15, 2022  2:54 PM. If you have any questions, ask your nurse or doctor.          STOP taking these medications    benzonatate 100 MG capsule Commonly known as: TESSALON Stopped by: Wilkie Aye, MD   cetirizine 10 MG tablet Commonly known as: ZyrTEC Allergy Stopped by: Wilkie Aye, MD   fluticasone 50 MCG/ACT nasal spray Commonly known as: FLONASE Stopped by: Wilkie Aye, MD   predniSONE 10 MG (21) Tbpk tablet Commonly known as: STERAPRED UNI-PAK 21 TAB Stopped by: Wilkie Aye, MD   predniSONE 20 MG tablet Commonly known as: DELTASONE Stopped by: Wilkie Aye, MD   promethazine-dextromethorphan 6.25-15 MG/5ML syrup Commonly known as: PROMETHAZINE-DM Stopped by: Wilkie Aye, MD       TAKE these medications    amLODipine 5 MG tablet Commonly known as: NORVASC Take 5 mg by mouth daily.   aspirin EC 325 MG tablet Take 650 mg by mouth daily as needed for pain.    losartan 50 MG tablet Commonly known as: COZAAR Take 50 mg by mouth daily.   traZODone 50 MG tablet Commonly known as: DESYREL Take 50 mg by mouth at bedtime.        Allergies: No Known Allergies  Family History: No family history on file.  Social History:  reports that he has quit smoking. He has quit using smokeless tobacco. He reports current alcohol use. He reports that he does not use drugs.  ROS: All other review of systems were reviewed and are negative except what is noted above in HPI  Physical Exam: BP (!) 179/96   Pulse (!) 103   Constitutional:  Alert and oriented, No acute distress. HEENT: [place] AT, moist mucus membranes.  Trachea midline, no masses. Cardiovascular: No clubbing, cyanosis, or edema. Respiratory: Normal respiratory effort, no increased work of breathing. GI: Abdomen is soft, nontender, nondistended, no abdominal masses GU: No CVA tenderness.  Lymph: No cervical or inguinal lymphadenopathy. Skin: No rashes, bruises or suspicious lesions. Neurologic: Grossly intact, no focal deficits, moving all 4 extremities. Psychiatric: Normal mood and affect.  Laboratory Data: No results found for: "WBC", "HGB", "HCT", "MCV", "PLT"  No results found for: "CREATININE"  No results found for: "PSA"  No results found for: "TESTOSTERONE"  No results found for: "HGBA1C"  Urinalysis    Component Value Date/Time   APPEARANCEUR Clear 03/23/2022 0904   GLUCOSEU Negative  03/23/2022 0904   BILIRUBINUR Negative 03/23/2022 0904   PROTEINUR Negative 03/23/2022 0904   NITRITE Negative 03/23/2022 0904   LEUKOCYTESUR Trace (A) 03/23/2022 0904    Lab Results  Component Value Date   LABMICR See below: 03/23/2022   WBCUA 6-10 (A) 03/23/2022   LABEPIT 0-10 03/23/2022   BACTERIA None seen 03/23/2022    Pertinent Imaging:  No results found for this or any previous visit.  No results found for this or any previous visit.  No results found for this or any  previous visit.  No results found for this or any previous visit.  No results found for this or any previous visit.  No valid procedures specified. No results found for this or any previous visit.  No results found for this or any previous visit.   Assessment & Plan:    1. Prostate cancer [facility]) -Patient elects to proceed with RALP with Dr. Berneice Heinrich - Urinalysis, Routine w reflex microscopic - CT Abdomen Pelvis W Wo Contrast; Future - NM Bone Scan Whole Body; Future   No follow-ups on file.  Wilkie Aye, MD  [facility] [place]

## 2022-06-16 LAB — URINALYSIS, ROUTINE W REFLEX MICROSCOPIC
Bilirubin, UA: NEGATIVE
Glucose, UA: NEGATIVE
Ketones, UA: NEGATIVE
Nitrite, UA: NEGATIVE
Protein,UA: NEGATIVE
RBC, UA: NEGATIVE
Specific Gravity, UA: 1.015 (ref 1.005–1.030)
Urobilinogen, Ur: 0.2 mg/dL (ref 0.2–1.0)
pH, UA: 6.5 (ref 5.0–7.5)

## 2022-06-16 LAB — MICROSCOPIC EXAMINATION
Bacteria, UA: NONE SEEN
RBC, Urine: NONE SEEN /hpf (ref 0–2)

## 2022-06-17 ENCOUNTER — Encounter: Payer: Self-pay | Admitting: Urology

## 2022-06-17 NOTE — Patient Instructions (Signed)

## 2022-06-21 ENCOUNTER — Telehealth: Payer: Self-pay

## 2022-06-21 ENCOUNTER — Other Ambulatory Visit: Payer: Self-pay | Admitting: Urology

## 2022-06-21 NOTE — Telephone Encounter (Signed)
Patient's CT was denied they requested a peer to peer review. I made the patient aware and he wanted the scan prior to surgery with Dr. Berneice Heinrich. He wanted to know if you could contact [employer] for the peer to peer. I have attached the Case number along with the phone number below  Case Number: [id number] Phone Number: [phone]

## 2022-07-06 ENCOUNTER — Telehealth: Payer: Self-pay

## 2022-07-06 NOTE — Telephone Encounter (Signed)
Patient's CT was denied they requested a peer to peer review. I made the patient aware and he wanted the scan prior to surgery with Dr. Manny. He wanted to know if you could contact [employer] for the peer to peer. I have attached the Case number along with the phone number below  Case Number: [id number] Phone Number: [phone] 

## 2022-07-13 DIAGNOSIS — C61 Malignant neoplasm of prostate: Secondary | ICD-10-CM | POA: Diagnosis not present

## 2022-07-13 DIAGNOSIS — I10 Essential (primary) hypertension: Secondary | ICD-10-CM | POA: Diagnosis not present

## 2022-07-18 ENCOUNTER — Telehealth: Payer: Self-pay

## 2022-07-18 NOTE — Telephone Encounter (Signed)
Patient came by office today inquiring on CT and Bone Scan. Patient made aware that Dr. Ronne Binning did a Peer to Peer and his insurance denied approval of the CT based on patients gleason score and PSA. Patient given the number to central scheduling to call and schedule Bone Scan. Patient voiced understanding.

## 2022-07-27 NOTE — Progress Notes (Addendum)
Anesthesia Review:  PCP: DR Carylon Perches - requested LOV from 07/13/22 OV note from 07/13/22 on chart  Cardiologist : none  Chest x-ray : EKG : 08/01/22 - LBBB- no EKG at office of DR Rulon Eisenmenger Ward, PAC aware.   Echo : Stress test: Cardiac Cath :  Activity level: can do a flight of stairs without difficutly  Sleep Study/ CPAP : none  Fasting Blood Sugar :      / Checks Blood Sugar -- times a day:   Blood Thinner/ Instructions /Last Dose: ASA / Instructions/ Last Dose :    325 mg aspirin

## 2022-07-29 NOTE — Patient Instructions (Addendum)
SURGICAL WAITING ROOM VISITATION  Patients having surgery or a procedure may have no more than 2 support people in the waiting area - these visitors may rotate.    Children under the age of 36 must have an adult with them who is not the patient.  Due to an increase in RSV and influenza rates and associated hospitalizations, children ages 87 and under may not visit patients in [facility] hospitals.  If the patient needs to stay at the hospital during part of their recovery, the visitor guidelines for inpatient rooms apply. Pre-op nurse will coordinate an appropriate time for 1 support person to accompany patient in pre-op.  This support person may not rotate.    Please refer to the [facility] website for the visitor guidelines for Inpatients (after your surgery is over and you are in a regular room).       Your procedure is scheduled on:  08/12/22    Report to [facility] Main Entrance    Report to admitting at  0515 AM   Call this number if you have problems the morning of surgery [phone]             Clear liquid diet the day before surgery.             Magnesium Citrated one bottle at 1200 noon day before surgery.    Do not eat food  or drink liquids :After Midnight.   Clear liquids-       Apple juice, white grape juice, white cranberry juice,      Coffee and tea with no milk, cream or creamer, sugar or sweetener is ok      Gatorade- no red,       Plain jello, fruit ices, popsicles- no red               If you have questions, please contact your surgeon's office.      Oral Hygiene is also important to reduce your risk of infection.                                    Remember - BRUSH YOUR TEETH THE MORNING OF SURGERY WITH YOUR REGULAR TOOTHPASTE  DENTURES WILL BE REMOVED PRIOR TO SURGERY PLEASE DO NOT APPLY "Poly grip" OR ADHESIVES!!!   Do NOT smoke after Midnight   Take these medicines the morning of surgery with A SIP OF WATER:  amlodipine , protonix if  needed   DO NOT TAKE ANY ORAL DIABETIC MEDICATIONS DAY OF YOUR SURGERY  Bring CPAP mask and tubing day of surgery.                              You may not have any metal on your body including hair pins, jewelry, and body piercing             Do not wear make-up, lotions, powders, perfumes/cologne, or deodorant  Do not wear nail polish including gel and S&S, artificial/acrylic nails, or any other type of covering on natural nails including finger and toenails. If you have artificial nails, gel coating, etc. that needs to be removed by a nail salon please have this removed prior to surgery or surgery may need to be canceled/ delayed if the surgeon/ anesthesia feels like they are unable to be safely monitored.   Do not shave  48 hours prior to surgery.               Men may shave face and neck.   Do not bring valuables to the hospital. [facility] IS NOT             RESPONSIBLE   FOR VALUABLES.   Contacts, glasses, dentures or bridgework may not be worn into surgery.   Bring small overnight bag day of surgery.   DO NOT BRING YOUR HOME MEDICATIONS TO THE HOSPITAL. PHARMACY WILL DISPENSE MEDICATIONS LISTED ON YOUR MEDICATION LIST TO YOU DURING YOUR ADMISSION IN THE HOSPITAL!    Patients discharged on the day of surgery will not be allowed to drive home.  Someone NEEDS to stay with you for the first 24 hours after anesthesia.   Special Instructions: Bring a copy of your healthcare power of attorney and living will documents the day of surgery if you haven't scanned them before.              Please read over the following fact sheets you were given: IF YOU HAVE QUESTIONS ABOUT YOUR PRE-OP INSTRUCTIONS PLEASE CALL [phone]   If you received a COVID test during your pre-op visit  it is requested that you wear a mask when out in public, stay away from anyone that may not be feeling well and notify your surgeon if you develop symptoms. If you test positive for Covid or have been in  contact with anyone that has tested positive in the last 10 days please notify you surgeon.    [facility] - Preparing for Surgery Before surgery, you can play an important role.  Because skin is not sterile, your skin needs to be as free of germs as possible.  You can reduce the number of germs on your skin by washing with CHG (chlorahexidine gluconate) soap before surgery.  CHG is an antiseptic cleaner which kills germs and bonds with the skin to continue killing germs even after washing. Please DO NOT use if you have an allergy to CHG or antibacterial soaps.  If your skin becomes reddened/irritated stop using the CHG and inform your nurse when you arrive at Short Stay. Do not shave (including legs and underarms) for at least 48 hours prior to the first CHG shower.  You may shave your face/neck. Please follow these instructions carefully:  1.  Shower with CHG Soap the night before surgery and the  morning of Surgery.  2.  If you choose to wash your hair, wash your hair first as usual with your  normal  shampoo.  3.  After you shampoo, rinse your hair and body thoroughly to remove the  shampoo.                           4.  Use CHG as you would any other liquid soap.  You can apply chg directly  to the skin and wash                       Gently with a scrungie or clean washcloth.  5.  Apply the CHG Soap to your body ONLY FROM THE NECK DOWN.   Do not use on face/ open                           Wound or open sores. Avoid contact with eyes, ears mouth and genitals (  private parts).                       Wash face,  Genitals (private parts) with your normal soap.             6.  Wash thoroughly, paying special attention to the area where your surgery  will be performed.  7.  Thoroughly rinse your body with warm water from the neck down.  8.  DO NOT shower/wash with your normal soap after using and rinsing off  the CHG Soap.                9.  Pat yourself dry with a clean towel.            10.  Wear  clean pajamas.            11.  Place clean sheets on your bed the night of your first shower and do not  sleep with pets. Day of Surgery : Do not apply any lotions/deodorants the morning of surgery.  Please wear clean clothes to the hospital/surgery center.  FAILURE TO FOLLOW THESE INSTRUCTIONS MAY RESULT IN THE CANCELLATION OF YOUR SURGERY PATIENT SIGNATURE_________________________________  NURSE SIGNATURE__________________________________  ________________________________________________________________________

## 2022-08-01 ENCOUNTER — Encounter (HOSPITAL_COMMUNITY)
Admission: RE | Admit: 2022-08-01 | Discharge: 2022-08-01 | Disposition: A | Discharge status: Home / Self Care | Payer: 59 | Source: Ambulatory Visit | Attending: Urology | Admitting: Urology

## 2022-08-01 ENCOUNTER — Other Ambulatory Visit: Payer: Self-pay

## 2022-08-01 ENCOUNTER — Encounter (HOSPITAL_COMMUNITY): Payer: Self-pay

## 2022-08-01 VITALS — BP 133/85 | HR 74 | Temp 98.1°F | Resp 16 | Ht 69.0 in | Wt 182.0 lb

## 2022-08-01 DIAGNOSIS — Z01818 Encounter for other preprocedural examination: Secondary | ICD-10-CM | POA: Insufficient documentation

## 2022-08-01 DIAGNOSIS — I447 Left bundle-branch block, unspecified: Secondary | ICD-10-CM | POA: Diagnosis not present

## 2022-08-01 HISTORY — DX: Personal history of urinary calculi: Z87.442

## 2022-08-01 HISTORY — DX: Malignant (primary) neoplasm, unspecified: C80.1

## 2022-08-01 HISTORY — DX: Unspecified osteoarthritis, unspecified site: M19.90

## 2022-08-01 LAB — CBC
HCT: 48.7 % (ref 39.0–52.0)
Hemoglobin: 16.3 g/dL (ref 13.0–17.0)
MCH: 28.9 pg (ref 26.0–34.0)
MCHC: 33.5 g/dL (ref 30.0–36.0)
MCV: 86.3 fL (ref 80.0–100.0)
Platelets: 279 10*3/uL (ref 150–400)
RBC: 5.64 MIL/uL (ref 4.22–5.81)
RDW: 13.1 % (ref 11.5–15.5)
WBC: 5.3 10*3/uL (ref 4.0–10.5)
nRBC: 0 % (ref 0.0–0.2)

## 2022-08-01 LAB — TYPE AND SCREEN
ABO/RH(D): A POS
Antibody Screen: NEGATIVE

## 2022-08-01 LAB — BASIC METABOLIC PANEL
Anion gap: 7 (ref 5–15)
BUN: 20 mg/dL (ref 6–20)
CO2: 23 mmol/L (ref 22–32)
Calcium: 9.4 mg/dL (ref 8.9–10.3)
Chloride: 106 mmol/L (ref 98–111)
Creatinine, Ser: 1.04 mg/dL (ref 0.61–1.24)
GFR, Estimated: >60 mL/min (ref >60)
Glucose, Bld: 123 mg/dL — ABNORMAL HIGH (ref 70–99)
Potassium: 4.2 mmol/L (ref 3.5–5.1)
Sodium: 136 mmol/L (ref 135–145)

## 2022-08-02 DIAGNOSIS — C61 Malignant neoplasm of prostate: Secondary | ICD-10-CM | POA: Diagnosis not present

## 2022-08-03 ENCOUNTER — Telehealth: Payer: Self-pay | Admitting: Internal Medicine

## 2022-08-03 NOTE — Telephone Encounter (Signed)
Will update all parties involved pt has appt with Dr. Jenene Slicker 08/04/22. See notes from pre op APP.

## 2022-08-03 NOTE — Telephone Encounter (Signed)
   Pre-operative Risk Assessment    Patient Name: Brandon Avila  DOB: [birth year] MRN: [id number]      Request for Surgical Clearance    Procedure:   Prostatectomy  Date of Surgery:  Clearance 08/12/22                                 Surgeon:  Dr. Sebastian Ache Surgeon's Group or Practice Name:  [facility] Phone number:  [phone]  Fax number:  [phone]   Type of Clearance Requested:   - Medical    Type of Anesthesia:  General    Additional requests/questions:   Caller stated patient will need medical clearance.  Patient has appointment scheduled on 7/18.  Signed, Annetta Maw   08/03/2022, 3:23 PM

## 2022-08-03 NOTE — Telephone Encounter (Signed)
Preoperative team, patient has appointment with cardiology tomorrow.  Please add preoperative cardiac evaluation to appointment.  I will defer cardiac evaluation to appointment. thank you for your help.  Brandon Avila. Bassy Fetterly NP-C     08/03/2022, 3:43 PM [facility] [address] Office [phone] Fax [phone]

## 2022-08-04 ENCOUNTER — Ambulatory Visit: Payer: 59 | Attending: Internal Medicine | Admitting: Internal Medicine

## 2022-08-04 ENCOUNTER — Encounter: Payer: Self-pay | Admitting: Internal Medicine

## 2022-08-04 VITALS — BP 116/72 | HR 83 | Ht 69.0 in | Wt 188.6 lb

## 2022-08-04 DIAGNOSIS — I10 Essential (primary) hypertension: Secondary | ICD-10-CM

## 2022-08-04 DIAGNOSIS — I447 Left bundle-branch block, unspecified: Secondary | ICD-10-CM

## 2022-08-04 NOTE — Patient Instructions (Signed)
Medication Instructions:  Your physician recommends that you continue on your current medications as directed. Please refer to the Current Medication list given to you today.  *If you need a refill on your cardiac medications before your next appointment, please call your pharmacy*   Lab Work: None If you have labs (blood work) drawn today and your tests are completely normal, you will receive your results only by: MyChart Message (if you have MyChart) OR A paper copy in the mail If you have any lab test that is abnormal or we need to change your treatment, we will call you to review the results.   Testing/Procedures: None   Follow-Up: At [facility], you and your health needs are our priority.  As part of our continuing mission to provide you with exceptional heart care, we have created designated Provider Care Teams.  These Care Teams include your primary Cardiologist (physician) and Advanced Practice Providers (APPs -  Physician Assistants and Nurse Practitioners) who all work together to provide you with the care you need, when you need it.  We recommend signing up for the patient portal called "MyChart".  Sign up information is provided on this After Visit Summary.  MyChart is used to connect with patients for Virtual Visits (Telemedicine).  Patients are able to view lab/test results, encounter notes, upcoming appointments, etc.  Non-urgent messages can be sent to your provider as well.   To learn more about what you can do with MyChart, go to [email or web address].    Your next appointment:   Follow up as needed.    Provider:   Vishnu Mallipeddi, MD    Other Instructions    

## 2022-08-04 NOTE — Progress Notes (Signed)
Cardiology Office Note  Date: 08/04/2022   ID: Brandon Avila, DOB 02-18-1965, MRN [id number]  PCP:  Carylon Perches, MD  Cardiologist:  Marjo Bicker, MD Electrophysiologist:  None   Reason for Office Visit: Preop   History of Present Illness: Brandon Avila is a 57 y.o. male known to have HTN, prostate cancer pending surgery was referred to cardiology clinic for preop clearance.  Patient is scheduled for robotic assisted laparoscopic prostatectomy on 08/12/2022 and due to new finding of LBBB on the EKG, patient was referred to cardiology clinic for preop cardiac risk stratification. Patient is physically active at baseline, can perform his ADLs, household chores, can do yard work if necessary, has more than 4 METS basically. No symptoms of angina or DOE with exertion.  No dizziness, palpitations, syncope.  No leg swelling.  Past Medical History:  Diagnosis Date   Arthritis    Cancer [facility])    prostate cancer   History of kidney stones    Hypertension     Past Surgical History:  Procedure Laterality Date   COLONOSCOPY N/A 09/28/2017   Procedure: COLONOSCOPY;  Surgeon: Malissa Hippo, MD;  [facility];  Service: Endoscopy;  Laterality: N/A;  730   POLYPECTOMY  09/28/2017   Procedure: POLYPECTOMY;  Surgeon: Malissa Hippo, MD;  Location: [facility];  Service: Endoscopy;;  colon    Current Outpatient Medications  Medication Sig Dispense Refill   amLODipine (NORVASC) 5 MG tablet Take 5 mg by mouth every evening.     aspirin 325 MG EC tablet Take 650 mg by mouth 2 (two) times daily as needed (headaches/pain.).     losartan (COZAAR) 100 MG tablet Take 100 mg by mouth in the morning.  4   naphazoline-glycerin (CLEAR EYES REDNESS) 0.012-0.25 % SOLN Place 1-2 drops into both eyes 4 (four) times daily as needed (dry/irritated/red eyes.).     pantoprazole (PROTONIX) 40 MG tablet Take 40 mg by mouth daily as needed (indigestion/heartburn.).     traZODone  (DESYREL) 100 MG tablet Take 100 mg by mouth at bedtime.     No current facility-administered medications for this visit.   Allergies:  Patient has no known allergies.   Social History: The patient  reports that he has quit smoking. He has never used smokeless tobacco. He reports current alcohol use. He reports that he does not use drugs.   Family History: The patient's family history includes Cancer in his mother; Stroke in his father.   ROS:  Please see the history of present illness. Otherwise, complete review of systems is positive for none.  All other systems are reviewed and negative.   Physical Exam: VS:  BP 116/72 (BP Location: Right Arm, Patient Position: Sitting, Cuff Size: Normal)   Pulse 83   Ht 5\' 9"  (1.753 m)   Wt 188 lb 9.6 oz (85.5 kg)   SpO2 97%   BMI 27.85 kg/m , BMI Body mass index is 27.85 kg/m.  Wt Readings from Last 3 Encounters:  08/04/22 188 lb 9.6 oz (85.5 kg)  08/01/22 182 lb (82.6 kg)  09/28/17 180 lb (81.6 kg)    General: Patient appears comfortable at rest. HEENT: Conjunctiva and lids normal, oropharynx clear with moist mucosa. Neck: Supple, no elevated JVP or carotid bruits, no thyromegaly. Lungs: Clear to auscultation, nonlabored breathing at rest. Cardiac: Regular rate and rhythm, no S3 or significant systolic murmur, no pericardial rub. Abdomen: Soft, nontender, no hepatomegaly, bowel sounds present, no guarding  or rebound. Extremities: No pitting edema, distal pulses 2+. Skin: Warm and dry. Musculoskeletal: No kyphosis. Neuropsychiatric: Alert and oriented x3, affect grossly appropriate.  Recent Labwork: 08/01/2022: BUN 20; Creatinine, Ser 1.04; Hemoglobin 16.3; Platelets 279; Potassium 4.2; Sodium 136  No results found for: "CHOL", "TRIG", "HDL", "CHOLHDL", "VLDL", "LDLCALC", "LDLDIRECT"   Assessment and Plan:  # Preop cardiac risk stratification for robotic assisted laparoscopic radical prostatectomy on 08/12/2022 under general  anesthesia -Patient has more than 4 METS, no angina or DOE with exertion.  EKG showed NSR and LBBB. He does not have active ACS, compensated and has no murmur on physical exam.  Based on RCRI score of 0, patient is at a low risk (3.9%) of perioperative cardiac complications. No indication for ischemia testing due to LBBB on the EKG. No further cardiac testing is indicated prior to proceeding with the planned procedure.  # HTN, controlled -Continue amlodipine 5 mg once daily, losartan 100 mg once daily.  I have spent a total of 45 minutes with patient reviewing chart, EKGs, labs and examining patient as well as establishing an assessment and plan that was discussed with the patient.  > 50% of time was spent in direct patient care.    Medication Adjustments/Labs and Tests Ordered: Current medicines are reviewed at length with the patient today.  Concerns regarding medicines are outlined above.   Tests Ordered: No orders of the defined types were placed in this encounter.   Medication Changes: No orders of the defined types were placed in this encounter.   Disposition:  Follow up prn  Signed, Shiro Ellerman Verne Spurr, MD, 08/04/2022 12:22 PM    [facility] at [facility] [address], [place], [place] [address]

## 2022-08-08 NOTE — Progress Notes (Signed)
Case: [id number] Date/Time: 08/12/22 0715   Procedures:      XI ROBOTIC ASSISTED LAPAROSCOPIC RADICAL PROSTATECTOMY AND INDOCYANINE GREEN DYE INJECTION - 3 HRS     PELVIC LYMPH NODE DISSECTION (Bilateral)   Anesthesia type: General   Pre-op diagnosis: PROSTATE CANCER   Location: [facility] ROOM 03 / [facility]   Surgeons: Loletta Parish., MD       DISCUSSION: Brandon Avila is a 57 yo male who presents to PAT prior to surgery above. PMH significant for prostate cancer, LBBB, HTN. Patient scheduled for radial prostatectomy on 7/26 and EKG showed new LBBB at PAT visit. Pt referred for cardiac clearance.  Patient saw cardiology on 08/04/22. Per Dr. Jenene Slicker "# Preop cardiac risk stratification for robotic assisted laparoscopic radical prostatectomy on 08/12/2022 under general anesthesia -Patient has more than 4 METS, no angina or DOE with exertion.  EKG showed NSR and LBBB. He does not have active ACS, compensated and has no murmur on physical exam.  Based on RCRI score of 0, patient is at a low risk (3.9%) of perioperative cardiac complications. No indication for ischemia testing due to LBBB on the EKG. No further cardiac testing is indicated prior to proceeding with the planned procedure."    VS: BP 133/85   Pulse 74   Temp 36.7 C (Oral)   Resp 16   Ht 5\' 9"  (1.753 m)   Wt 82.6 kg   SpO2 97%   BMI 26.88 kg/m   PROVIDERS: Carylon Perches, MD   LABS: Labs reviewed: Acceptable for surgery. (all labs ordered are listed, but only abnormal results are displayed)  Labs Reviewed  BASIC METABOLIC PANEL - Abnormal; Notable for the following components:      Result Value   Glucose, Bld 123 (*)    All other components within normal limits  CBC  TYPE AND SCREEN     IMAGES:   EKG: 08/01/22  NSR, rate 71 LBBB   CV:  Past Medical History:  Diagnosis Date   Arthritis    Cancer (HCC)    prostate cancer   History of kidney stones    Hypertension     Past Surgical History:   Procedure Laterality Date   COLONOSCOPY N/A 09/28/2017   Procedure: COLONOSCOPY;  Surgeon: Malissa Hippo, MD;  [facility];  Service: Endoscopy;  Laterality: N/A;  730   POLYPECTOMY  09/28/2017   Procedure: POLYPECTOMY;  Surgeon: Malissa Hippo, MD;  [facility];  Service: Endoscopy;;  colon    MEDICATIONS:  amLODipine (NORVASC) 5 MG tablet   aspirin 325 MG EC tablet   losartan (COZAAR) 100 MG tablet   naphazoline-glycerin (CLEAR EYES REDNESS) 0.012-0.25 % SOLN   pantoprazole (PROTONIX) 40 MG tablet   traZODone (DESYREL) 100 MG tablet   No current facility-administered medications for this encounter.   Marcille Blanco [facility] Short Stay/Anesthesiology [facility] Phone [phone] 08/08/2022 9:21 AM

## 2022-08-08 NOTE — Anesthesia Preprocedure Evaluation (Addendum)
Anesthesia Evaluation  Patient identified by MRN, date of birth, ID band Patient awake    Reviewed: Allergy & Precautions, NPO status , Patient's Chart, lab work & pertinent test results  History of Anesthesia Complications Negative for: history of anesthetic complications  Airway Mallampati: II  TM Distance: >3 FB Neck ROM: Full    Dental no notable dental hx. (+) Dental Advisory Given   Pulmonary former smoker   Pulmonary exam normal        Cardiovascular hypertension, Pt. on medications Normal cardiovascular exam     Neuro/Psych negative neurological ROS     GI/Hepatic negative GI ROS, Neg liver ROS,,,  Endo/Other  negative endocrine ROS    Renal/GU negative Renal ROS     Musculoskeletal negative musculoskeletal ROS (+)    Abdominal   Peds  Hematology negative hematology ROS (+)   Anesthesia Other Findings   Reproductive/Obstetrics                             Anesthesia Physical Anesthesia Plan  ASA: 2  Anesthesia Plan: General   Post-op Pain Management: Tylenol PO (pre-op)*, Toradol IV (intra-op)* and Lidocaine infusion*   Induction: Intravenous  PONV Risk Score and Plan: 4 or greater and Ondansetron, Dexamethasone, Midazolam and Scopolamine patch - Pre-op  Airway Management Planned: Oral ETT  Additional Equipment:   Intra-op Plan:   Post-operative Plan: Extubation in OR  Informed Consent: I have reviewed the patients History and Physical, chart, labs and discussed the procedure including the risks, benefits and alternatives for the proposed anesthesia with the patient or authorized representative who has indicated his/her understanding and acceptance.     Dental advisory given  Plan Discussed with: Anesthesiologist and CRNA  Anesthesia Plan Comments: (See PAT note from 7/15 by K Gekas PA-C )        Anesthesia Quick Evaluation

## 2022-08-12 ENCOUNTER — Observation Stay (HOSPITAL_COMMUNITY)
Admission: RE | Admit: 2022-08-12 | Discharge: 2022-08-14 | Disposition: A | Discharge status: Home / Self Care | Payer: 59 | Source: Ambulatory Visit | Attending: Urology | Admitting: Urology

## 2022-08-12 ENCOUNTER — Ambulatory Visit (HOSPITAL_BASED_OUTPATIENT_CLINIC_OR_DEPARTMENT_OTHER): Payer: 59 | Admitting: Anesthesiology

## 2022-08-12 ENCOUNTER — Other Ambulatory Visit: Payer: Self-pay

## 2022-08-12 ENCOUNTER — Ambulatory Visit (HOSPITAL_COMMUNITY): Payer: 59 | Admitting: Physician Assistant

## 2022-08-12 ENCOUNTER — Encounter (HOSPITAL_COMMUNITY): Payer: Self-pay | Admitting: Urology

## 2022-08-12 ENCOUNTER — Encounter (HOSPITAL_COMMUNITY)
Admission: RE | Disposition: A | Discharge status: Home / Self Care | Payer: Self-pay | Source: Ambulatory Visit | Attending: Urology

## 2022-08-12 DIAGNOSIS — I10 Essential (primary) hypertension: Secondary | ICD-10-CM | POA: Insufficient documentation

## 2022-08-12 DIAGNOSIS — Z79899 Other long term (current) drug therapy: Secondary | ICD-10-CM | POA: Insufficient documentation

## 2022-08-12 DIAGNOSIS — C61 Malignant neoplasm of prostate: Secondary | ICD-10-CM | POA: Diagnosis not present

## 2022-08-12 DIAGNOSIS — K4090 Unilateral inguinal hernia, without obstruction or gangrene, not specified as recurrent: Secondary | ICD-10-CM | POA: Insufficient documentation

## 2022-08-12 DIAGNOSIS — Z87891 Personal history of nicotine dependence: Secondary | ICD-10-CM | POA: Diagnosis not present

## 2022-08-12 DIAGNOSIS — Z01818 Encounter for other preprocedural examination: Secondary | ICD-10-CM

## 2022-08-12 DIAGNOSIS — Z7982 Long term (current) use of aspirin: Secondary | ICD-10-CM | POA: Insufficient documentation

## 2022-08-12 HISTORY — PX: ROBOT ASSISTED LAPAROSCOPIC RADICAL PROSTATECTOMY: SHX5141

## 2022-08-12 HISTORY — PX: LYMPH NODE DISSECTION: SHX5087

## 2022-08-12 LAB — HEMOGLOBIN AND HEMATOCRIT, BLOOD
HCT: 43.9 % (ref 39.0–52.0)
Hemoglobin: 14.8 g/dL (ref 13.0–17.0)

## 2022-08-12 SURGERY — PROSTATECTOMY, RADICAL, ROBOT-ASSISTED, LAPAROSCOPIC
Anesthesia: General | Site: Abdomen

## 2022-08-12 MED ORDER — TRAZODONE HCL 100 MG PO TABS
100.0000 mg | ORAL_TABLET | Freq: Every day | ORAL | Status: DC
  Administered 2022-08-12 – 2022-08-13 (×2): 100 mg via ORAL
  Filled 2022-08-12 (×2): qty 1

## 2022-08-12 MED ORDER — LACTATED RINGERS IR SOLN
Status: DC | PRN
  Administered 2022-08-12: 1000 mL

## 2022-08-12 MED ORDER — SULFAMETHOXAZOLE-TRIMETHOPRIM 800-160 MG PO TABS: 1.0000 | ORAL_TABLET | Freq: Two times a day (BID) | ORAL | 0 refills | Status: DC

## 2022-08-12 MED ORDER — PANTOPRAZOLE SODIUM 40 MG PO TBEC: 40.0000 mg | DELAYED_RELEASE_TABLET | Freq: Every day | ORAL | Status: DC | PRN

## 2022-08-12 MED ORDER — FENTANYL CITRATE (PF) 250 MCG/5ML IJ SOLN
INTRAMUSCULAR | Status: AC
  Filled 2022-08-12: qty 5

## 2022-08-12 MED ORDER — ACETAMINOPHEN 500 MG PO TABS
1000.0000 mg | ORAL_TABLET | Freq: Once | ORAL | Status: AC
  Administered 2022-08-12: 1000 mg via ORAL
  Filled 2022-08-12: qty 2

## 2022-08-12 MED ORDER — HYDROMORPHONE HCL 1 MG/ML IJ SOLN
0.5000 mg | INTRAMUSCULAR | Status: DC | PRN
  Administered 2022-08-12 (×2): 1 mg via INTRAVENOUS
  Filled 2022-08-12 (×2): qty 1

## 2022-08-12 MED ORDER — FENTANYL CITRATE (PF) 100 MCG/2ML IJ SOLN
INTRAMUSCULAR | Status: DC | PRN
  Administered 2022-08-12: 50 ug via INTRAVENOUS
  Administered 2022-08-12: 100 ug via INTRAVENOUS
  Administered 2022-08-12: 50 ug via INTRAVENOUS
  Administered 2022-08-12: 100 ug via INTRAVENOUS
  Administered 2022-08-12: 50 ug via INTRAVENOUS

## 2022-08-12 MED ORDER — ACETAMINOPHEN 500 MG PO TABS
1000.0000 mg | ORAL_TABLET | Freq: Four times a day (QID) | ORAL | Status: AC
  Administered 2022-08-12 – 2022-08-13 (×4): 1000 mg via ORAL
  Filled 2022-08-12 (×4): qty 2

## 2022-08-12 MED ORDER — MIDAZOLAM HCL 2 MG/2ML IJ SOLN
INTRAMUSCULAR | Status: AC
  Filled 2022-08-12: qty 2

## 2022-08-12 MED ORDER — AMLODIPINE BESYLATE 5 MG PO TABS
5.0000 mg | ORAL_TABLET | Freq: Every evening | ORAL | Status: DC
  Administered 2022-08-12 – 2022-08-13 (×2): 5 mg via ORAL
  Filled 2022-08-12 (×2): qty 1

## 2022-08-12 MED ORDER — FENTANYL CITRATE PF 50 MCG/ML IJ SOSY
PREFILLED_SYRINGE | INTRAMUSCULAR | Status: AC
  Filled 2022-08-12: qty 2

## 2022-08-12 MED ORDER — SODIUM CHLORIDE (PF) 0.9 % IJ SOLN
INTRAMUSCULAR | Status: AC
  Filled 2022-08-12: qty 20

## 2022-08-12 MED ORDER — OXYCODONE HCL 5 MG PO TABS
5.0000 mg | ORAL_TABLET | ORAL | Status: DC | PRN
  Administered 2022-08-12 – 2022-08-13 (×3): 5 mg via ORAL
  Filled 2022-08-12 (×3): qty 1

## 2022-08-12 MED ORDER — ONDANSETRON HCL 4 MG/2ML IJ SOLN: 4.0000 mg | INTRAMUSCULAR | Status: DC | PRN

## 2022-08-12 MED ORDER — SODIUM CHLORIDE (PF) 0.9 % IJ SOLN
INTRAMUSCULAR | Status: DC | PRN
  Administered 2022-08-12: 20 mL

## 2022-08-12 MED ORDER — DIPHENHYDRAMINE HCL 50 MG/ML IJ SOLN
12.5000 mg | Freq: Four times a day (QID) | INTRAMUSCULAR | Status: DC | PRN
  Administered 2022-08-12: 25 mg via INTRAVENOUS
  Filled 2022-08-12: qty 1

## 2022-08-12 MED ORDER — HYDROCODONE-ACETAMINOPHEN 5-325 MG PO TABS: 1.0000 | ORAL_TABLET | Freq: Four times a day (QID) | ORAL | 0 refills | Status: DC | PRN

## 2022-08-12 MED ORDER — BUPIVACAINE LIPOSOME 1.3 % IJ SUSP
INTRAMUSCULAR | Status: AC
  Filled 2022-08-12: qty 20

## 2022-08-12 MED ORDER — TRIPLE ANTIBIOTIC 3.5-400-5000 EX OINT: 1.0000 | TOPICAL_OINTMENT | Freq: Three times a day (TID) | CUTANEOUS | Status: DC | PRN

## 2022-08-12 MED ORDER — PROPOFOL 10 MG/ML IV BOLUS
INTRAVENOUS | Status: AC
  Filled 2022-08-12: qty 20

## 2022-08-12 MED ORDER — OXYCODONE HCL 5 MG/5ML PO SOLN: 5.0000 mg | Freq: Once | ORAL | Status: DC | PRN

## 2022-08-12 MED ORDER — AMISULPRIDE (ANTIEMETIC) 5 MG/2ML IV SOLN: 10.0000 mg | Freq: Once | INTRAVENOUS | Status: DC | PRN

## 2022-08-12 MED ORDER — ACETAMINOPHEN 10 MG/ML IV SOLN: 1000.0000 mg | Freq: Once | INTRAVENOUS | Status: DC | PRN

## 2022-08-12 MED ORDER — FENTANYL CITRATE (PF) 100 MCG/2ML IJ SOLN
INTRAMUSCULAR | Status: AC
  Filled 2022-08-12: qty 2

## 2022-08-12 MED ORDER — SCOPOLAMINE 1 MG/3DAYS TD PT72
1.0000 | MEDICATED_PATCH | TRANSDERMAL | Status: DC
  Administered 2022-08-12: 1.5 mg via TRANSDERMAL
  Filled 2022-08-12: qty 1

## 2022-08-12 MED ORDER — ROCURONIUM BROMIDE 10 MG/ML (PF) SYRINGE
PREFILLED_SYRINGE | INTRAVENOUS | Status: DC | PRN
  Administered 2022-08-12: 10 mg via INTRAVENOUS
  Administered 2022-08-12: 80 mg via INTRAVENOUS
  Administered 2022-08-12: 20 mg via INTRAVENOUS

## 2022-08-12 MED ORDER — OXYCODONE HCL 5 MG PO TABS: 5.0000 mg | ORAL_TABLET | Freq: Once | ORAL | Status: DC | PRN

## 2022-08-12 MED ORDER — FENTANYL CITRATE PF 50 MCG/ML IJ SOSY
25.0000 ug | PREFILLED_SYRINGE | INTRAMUSCULAR | Status: DC | PRN
  Administered 2022-08-12 (×2): 50 ug via INTRAVENOUS

## 2022-08-12 MED ORDER — DOCUSATE SODIUM 100 MG PO CAPS
100.0000 mg | ORAL_CAPSULE | Freq: Two times a day (BID) | ORAL | Status: DC
  Administered 2022-08-12 – 2022-08-14 (×5): 100 mg via ORAL
  Filled 2022-08-12 (×5): qty 1

## 2022-08-12 MED ORDER — CHLORHEXIDINE GLUCONATE 0.12 % MT SOLN
15.0000 mL | Freq: Once | OROMUCOSAL | Status: AC
  Administered 2022-08-12: 15 mL via OROMUCOSAL

## 2022-08-12 MED ORDER — PHENYLEPHRINE 80 MCG/ML (10ML) SYRINGE FOR IV PUSH (FOR BLOOD PRESSURE SUPPORT)
PREFILLED_SYRINGE | INTRAVENOUS | Status: DC | PRN
  Administered 2022-08-12: 80 ug via INTRAVENOUS

## 2022-08-12 MED ORDER — SODIUM CHLORIDE 0.45 % IV SOLN
INTRAVENOUS | Status: DC
  Administered 2022-08-13: 75 mL/h via INTRAVENOUS

## 2022-08-12 MED ORDER — LOSARTAN POTASSIUM 50 MG PO TABS
100.0000 mg | ORAL_TABLET | Freq: Every day | ORAL | Status: DC
  Administered 2022-08-13 – 2022-08-14 (×2): 100 mg via ORAL
  Filled 2022-08-12 (×2): qty 2

## 2022-08-12 MED ORDER — DOCUSATE SODIUM 100 MG PO CAPS: 100.0000 mg | ORAL_CAPSULE | Freq: Two times a day (BID) | ORAL | Status: AC

## 2022-08-12 MED ORDER — SODIUM CHLORIDE 0.9 % IV BOLUS
1000.0000 mL | Freq: Once | INTRAVENOUS | Status: AC
  Administered 2022-08-12: 1000 mL via INTRAVENOUS

## 2022-08-12 MED ORDER — MIDAZOLAM HCL 5 MG/5ML IJ SOLN
INTRAMUSCULAR | Status: DC | PRN
  Administered 2022-08-12: 2 mg via INTRAVENOUS

## 2022-08-12 MED ORDER — DIPHENHYDRAMINE HCL 12.5 MG/5ML PO ELIX: 12.5000 mg | ORAL_SOLUTION | Freq: Four times a day (QID) | ORAL | Status: DC | PRN

## 2022-08-12 MED ORDER — SUGAMMADEX SODIUM 200 MG/2ML IV SOLN
INTRAVENOUS | Status: DC | PRN
  Administered 2022-08-12: 200 mg via INTRAVENOUS

## 2022-08-12 MED ORDER — MAGNESIUM CITRATE PO SOLN
1.0000 | Freq: Once | ORAL | Status: DC
  Filled 2022-08-12: qty 296

## 2022-08-12 MED ORDER — LIDOCAINE 2% (20 MG/ML) 5 ML SYRINGE
INTRAMUSCULAR | Status: DC | PRN
  Administered 2022-08-12: 100 mg via INTRAVENOUS

## 2022-08-12 MED ORDER — LACTATED RINGERS IV SOLN: INTRAVENOUS | Status: DC

## 2022-08-12 MED ORDER — ORAL CARE MOUTH RINSE: 15.0000 mL | Freq: Once | OROMUCOSAL | Status: AC

## 2022-08-12 MED ORDER — PROPOFOL 10 MG/ML IV BOLUS
INTRAVENOUS | Status: DC | PRN
Start: 2022-08-12 — End: 2022-08-12
  Administered 2022-08-12: 160 mg via INTRAVENOUS

## 2022-08-12 MED ORDER — ONDANSETRON HCL 4 MG/2ML IJ SOLN
INTRAMUSCULAR | Status: AC
  Filled 2022-08-12: qty 2

## 2022-08-12 MED ORDER — CEFAZOLIN SODIUM-DEXTROSE 2-4 GM/100ML-% IV SOLN
2.0000 g | INTRAVENOUS | Status: AC
  Administered 2022-08-12: 2 g via INTRAVENOUS
  Filled 2022-08-12: qty 100

## 2022-08-12 MED ORDER — HYOSCYAMINE SULFATE 0.125 MG SL SUBL
0.1250 mg | SUBLINGUAL_TABLET | SUBLINGUAL | Status: DC | PRN
  Administered 2022-08-12: 0.125 mg via SUBLINGUAL
  Filled 2022-08-12: qty 1

## 2022-08-12 MED ORDER — DEXMEDETOMIDINE HCL IN NACL 80 MCG/20ML IV SOLN
INTRAVENOUS | Status: DC | PRN
Start: 2022-08-12 — End: 2022-08-12
  Administered 2022-08-12: 20 ug via INTRAVENOUS

## 2022-08-12 MED ORDER — STERILE WATER FOR IRRIGATION IR SOLN
Status: DC | PRN
  Administered 2022-08-12: 1000 mL

## 2022-08-12 MED ORDER — BUPIVACAINE LIPOSOME 1.3 % IJ SUSP
INTRAMUSCULAR | Status: DC | PRN
  Administered 2022-08-12: 20 mL

## 2022-08-12 SURGICAL SUPPLY — 75 items
ADH SKN CLS APL DERMABOND .7 (GAUZE/BANDAGES/DRESSINGS) ×2
APL PRP STRL LF DISP 70% ISPRP (MISCELLANEOUS) ×2
APL SWBSTK 6 STRL LF DISP (MISCELLANEOUS) ×2
APPLICATOR COTTON TIP 6 STRL (MISCELLANEOUS) ×2 IMPLANT
APPLICATOR COTTON TIP 6IN STRL (MISCELLANEOUS) ×2
BAG COUNTER SPONGE SURGICOUNT (BAG) IMPLANT
BAG SPNG CNTER NS LX DISP (BAG)
CATH FOLEY 2WAY SLVR 18FR 30CC (CATHETERS) ×2 IMPLANT
CATH TIEMANN FOLEY 18FR 5CC (CATHETERS) ×2 IMPLANT
CHLORAPREP W/TINT 26 (MISCELLANEOUS) ×2 IMPLANT
CLIP LIGATING HEM O LOK PURPLE (MISCELLANEOUS) ×4 IMPLANT
CNTNR URN SCR LID CUP LEK RST (MISCELLANEOUS) ×2 IMPLANT
CONT SPEC 4OZ STRL OR WHT (MISCELLANEOUS) ×2
COVER SURGICAL LIGHT HANDLE (MISCELLANEOUS) ×2 IMPLANT
COVER TIP SHEARS 8 DVNC (MISCELLANEOUS) ×2 IMPLANT
CUTTER ECHEON FLEX ENDO 45 340 (ENDOMECHANICALS) ×2 IMPLANT
DERMABOND ADVANCED .7 DNX12 (GAUZE/BANDAGES/DRESSINGS) ×2 IMPLANT
DRAPE ARM DVNC X/XI (DISPOSABLE) ×8 IMPLANT
DRAPE COLUMN DVNC XI (DISPOSABLE) ×2 IMPLANT
DRAPE SURG IRRIG POUCH 19X23 (DRAPES) ×2 IMPLANT
DRIVER NDL LRG 8 DVNC XI (INSTRUMENTS) ×4 IMPLANT
DRIVER NDLE LRG 8 DVNC XI (INSTRUMENTS) ×4 IMPLANT
DRSG TEGADERM 4X4.75 (GAUZE/BANDAGES/DRESSINGS) ×2 IMPLANT
ELECT PENCIL ROCKER SW 15FT (MISCELLANEOUS) ×2 IMPLANT
ELECT REM PT RETURN 15FT ADLT (MISCELLANEOUS) ×2 IMPLANT
FORCEPS BPLR LNG DVNC XI (INSTRUMENTS) ×2 IMPLANT
FORCEPS PROGRASP DVNC XI (FORCEP) ×2 IMPLANT
GAUZE 4X4 16PLY ~~LOC~~+RFID DBL (SPONGE) IMPLANT
GAUZE SPONGE 2X2 8PLY STRL LF (GAUZE/BANDAGES/DRESSINGS) IMPLANT
GAUZE SPONGE 4X4 12PLY STRL (GAUZE/BANDAGES/DRESSINGS) ×2 IMPLANT
GLOVE BIO SURGEON STRL SZ 6.5 (GLOVE) ×2 IMPLANT
GLOVE BIOGEL PI IND STRL 7.5 (GLOVE) ×2 IMPLANT
GLOVE SURG LX STRL 7.5 STRW (GLOVE) ×4 IMPLANT
GOWN SRG XL LVL 4 BRTHBL STRL (GOWNS) ×2 IMPLANT
GOWN STRL NON-REIN XL LVL4 (GOWNS) ×2
GOWN STRL REUS W/ TWL XL LVL3 (GOWN DISPOSABLE) ×4 IMPLANT
GOWN STRL REUS W/TWL XL LVL3 (GOWN DISPOSABLE) ×4
HOLDER FOLEY CATH W/STRAP (MISCELLANEOUS) ×2 IMPLANT
IRRIG SUCT STRYKERFLOW 2 WTIP (MISCELLANEOUS) ×2
IRRIGATION SUCT STRKRFLW 2 WTP (MISCELLANEOUS) ×2 IMPLANT
IV LACTATED RINGERS 1000ML (IV SOLUTION) ×2 IMPLANT
KIT PROCEDURE DVNC SI (MISCELLANEOUS) ×2 IMPLANT
KIT TURNOVER KIT A (KITS) IMPLANT
NDL INSUFFLATION 14GA 120MM (NEEDLE) ×2 IMPLANT
NDL SPNL 22GX7 QUINCKE BK (NEEDLE) ×2 IMPLANT
NEEDLE INSUFFLATION 14GA 120MM (NEEDLE) ×2 IMPLANT
NEEDLE SPNL 22GX7 QUINCKE BK (NEEDLE) ×2 IMPLANT
PACK ROBOT UROLOGY CUSTOM (CUSTOM PROCEDURE TRAY) ×2 IMPLANT
PAD POSITIONING PINK XL (MISCELLANEOUS) ×2 IMPLANT
PLUG CATH AND CAP STRL 200 (CATHETERS) ×2 IMPLANT
PORT ACCESS TROCAR AIRSEAL 12 (TROCAR) ×2 IMPLANT
RELOAD STAPLE 45 4.1 GRN THCK (STAPLE) ×2 IMPLANT
SCISSORS MNPLR CVD DVNC XI (INSTRUMENTS) ×2 IMPLANT
SEAL UNIV 5-12 XI (MISCELLANEOUS) ×8 IMPLANT
SET CYSTO W/LG BORE CLAMP LF (SET/KITS/TRAYS/PACK) IMPLANT
SET TRI-LUMEN FLTR TB AIRSEAL (TUBING) ×2 IMPLANT
SOL ELECTROSURG ANTI STICK (MISCELLANEOUS) ×2
SOL PREP POV-IOD 4OZ 10% (MISCELLANEOUS) ×2 IMPLANT
SOLUTION ELECTROSURG ANTI STCK (MISCELLANEOUS) ×2 IMPLANT
SPIKE FLUID TRANSFER (MISCELLANEOUS) ×2 IMPLANT
SPONGE T-LAP 4X18 ~~LOC~~+RFID (SPONGE) ×2 IMPLANT
STAPLE RELOAD 45 GRN (STAPLE) ×2 IMPLANT
STAPLE RELOAD 45MM GREEN (STAPLE) ×2
SUT ETHIBOND CT1 BRD #0 30IN (SUTURE) IMPLANT
SUT ETHILON 3 0 PS 1 (SUTURE) ×2 IMPLANT
SUT MNCRL AB 4-0 PS2 18 (SUTURE) ×4 IMPLANT
SUT PDS AB 1 CT1 27 (SUTURE) ×4 IMPLANT
SUT VIC AB 2-0 SH 27 (SUTURE) ×2
SUT VIC AB 2-0 SH 27X BRD (SUTURE) ×2 IMPLANT
SUT VICRYL 0 UR6 27IN ABS (SUTURE) ×2 IMPLANT
SUT VLOC BARB 180 ABS3/0GR12 (SUTURE) ×6
SUTURE VLOC BRB 180 ABS3/0GR12 (SUTURE) ×6 IMPLANT
SYR 27GX1/2 1ML LL SAFETY (SYRINGE) ×2 IMPLANT
TOWEL OR NON WOVEN STRL DISP B (DISPOSABLE) ×2 IMPLANT
WATER STERILE IRR 1000ML POUR (IV SOLUTION) ×2 IMPLANT

## 2022-08-12 NOTE — Transfer of Care (Signed)
Immediate Anesthesia Transfer of Care Note  Patient: Brandon Avila  Procedure(s) Performed: XI ROBOTIC ASSISTED LAPAROSCOPIC RADICAL PROSTATECTOMY AND INDOCYANINE GREEN DYE INJECTION (Abdomen) PELVIC LYMPH NODE DISSECTION (Bilateral: Abdomen)  Patient Location: PACU  Anesthesia Type:General  Level of Consciousness: sedated, patient cooperative, and responds to stimulation  Airway & Oxygen Therapy: Patient Spontanous Breathing and Patient connected to face mask oxygen  Post-op Assessment: Report given to RN and Post -op Vital signs reviewed and stable  Post vital signs: Reviewed and stable  Last Vitals:  Vitals Value Taken Time  BP 126/73 08/12/22 1149  Temp 36.6 C 08/12/22 1130  Pulse 75 08/12/22 1149  Resp 18 08/12/22 1149  SpO2 93 % 08/12/22 1149    Last Pain:  Vitals:   08/12/22 1201  TempSrc:   PainSc: 10-Worst pain ever         Complications: No notable events documented.

## 2022-08-12 NOTE — Discharge Instructions (Signed)
1- Drain Sites - You may have some mild persistent drainage from old drain site for several days, this is normal. This can be covered with cotton gauze for convenience.  2 - Stiches - Your stitches are all dissolvable. You may notice a "loose thread" at your incisions, these are normal and require no intervention. You may cut them flush to the skin with fingernail clippers if needed for comfort.  3 - Diet - No restrictions  4 - Activity - No heavy lifting / straining (any activities that require valsalva or "bearing down") x 4 weeks. Otherwise, no restrictions.  5 - Bathing - You may shower immediately. Do not take a bath or get into swimming pool where incision sites are submersed in water x 4 weeks.   6 - Catheter - Will remain in place until removed at your next appointment. It may be cleaned with soap and water in the shower. It may be disconnected from the drain bag while in the shower to avoid tripping over the tube. You may apply Neosporin or Vaseline ointment as needed to the tip of the penis where the catheter inserts to reduce friction and irritation in this spot.   7 - When to Call the Doctor - Call MD for any fever >102, any acute wound problems, or any severe nausea / vomiting. You can call the [facility] Office ([phone]) 24 hours a day 365 days a year. It will roll-over to the answering service and on-call physician after hours.  

## 2022-08-12 NOTE — Anesthesia Postprocedure Evaluation (Signed)
Anesthesia Post Note  Patient: WILDER ZENI  Procedure(s) Performed: XI ROBOTIC ASSISTED LAPAROSCOPIC RADICAL PROSTATECTOMY AND INDOCYANINE GREEN DYE INJECTION (Abdomen) PELVIC LYMPH NODE DISSECTION (Bilateral: Abdomen)     Patient location during evaluation: PACU Anesthesia Type: General Level of consciousness: sedated Pain management: pain level controlled Vital Signs Assessment: post-procedure vital signs reviewed and stable Respiratory status: spontaneous breathing and respiratory function stable Cardiovascular status: stable Postop Assessment: no apparent nausea or vomiting Anesthetic complications: no   No notable events documented.  Last Vitals:  Vitals:   08/12/22 1130 08/12/22 1149  BP: 115/75 126/73  Pulse: 78 75  Resp: 17 18  Temp: 36.6 C   SpO2: 98% 93%                 Gerldine Suleiman DANIEL

## 2022-08-12 NOTE — Anesthesia Procedure Notes (Signed)
Procedure Name: Intubation Date/Time: 08/12/2022 7:49 AM  Performed by: Kizzie Fantasia, CRNAPre-anesthesia Checklist: Patient identified, Emergency Drugs available, Suction available, Patient being monitored and Timeout performed Patient Re-evaluated:Patient Re-evaluated prior to induction Oxygen Delivery Method: Circle system utilized Preoxygenation: Pre-oxygenation with 100% oxygen Induction Type: IV induction Ventilation: Mask ventilation without difficulty Laryngoscope Size: Mac and 4 Grade View: Grade I Tube type: Oral Tube size: 7.5 mm Number of attempts: 1 Airway Equipment and Method: Stylet Placement Confirmation: ETT inserted through vocal cords under direct vision, positive ETCO2 and breath sounds checked- equal and bilateral Secured at: 23 cm Tube secured with: Tape Dental Injury: Teeth and Oropharynx as per pre-operative assessment

## 2022-08-12 NOTE — H&P (Signed)
Brandon Avila is an 57 y.o. male.    Chief Complaint: Pre-OP Prostatectomy  HPI:   1 - Moderate Risk Prostate Cancer - 6/12 cores mix of grade 1 and 2 cancer on BX 04/2022 on eval PSA 4.0. TRUS 44mL no medain.   PMH sig for HTN. NO CV disease / blood thinners. Wife Belinda involved. 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 His PCP is Carylon Perches MD in [place].   Today " Brandon Avila " is seen to proceed with prostatectomy and node dissection. No interval fevers. Has cards clearance from incidental LBBB. Cr 1.0, Hgb 16.3.    Past Medical History:  Diagnosis Date   Arthritis    Cancer [facility])    prostate cancer   History of kidney stones    Hypertension     Past Surgical History:  Procedure Laterality Date   COLONOSCOPY N/A 09/28/2017   Procedure: COLONOSCOPY;  Surgeon: Malissa Hippo, MD;  Location: [facility];  Service: Endoscopy;  Laterality: N/A;  730   POLYPECTOMY  09/28/2017   Procedure: POLYPECTOMY;  Surgeon: Malissa Hippo, MD;  Location: [facility];  Service: Endoscopy;;  colon    Family History  Problem Relation Age of Onset   Cancer Mother        Melanoma   Stroke Father        around 77yo- full recovery   Social History:  reports that he has quit smoking. He has never used smokeless tobacco. He reports current alcohol use. He reports that he does not use drugs.  Allergies: No Known Allergies  Medications Prior to Admission  Medication Sig Dispense Refill   amLODipine (NORVASC) 5 MG tablet Take 5 mg by mouth every evening.     losartan (COZAAR) 100 MG tablet Take 100 mg by mouth in the morning.  4   pantoprazole (PROTONIX) 40 MG tablet Take 40 mg by mouth daily as needed (indigestion/heartburn.).     traZODone (DESYREL) 100 MG tablet Take 100 mg by mouth at bedtime.     aspirin 325 MG EC tablet Take 650 mg by mouth 2 (two) times daily as needed (headaches/pain.).      naphazoline-glycerin (CLEAR EYES REDNESS) 0.012-0.25 % SOLN Place 1-2 drops into both eyes 4 (four) times daily as needed (dry/irritated/red eyes.).      No results found for this or any previous visit (from the past 48 hour(s)). No results found.  Review of Systems  Constitutional:  Negative for chills and fever.  All other systems reviewed and are negative.   Blood pressure 133/82, pulse 87, temperature 98.4 F (36.9 C), temperature source Oral, resp. rate 18, SpO2 97%. Physical Exam Vitals reviewed.  HENT:     Head: Normocephalic.  Eyes:     Pupils: Pupils are equal, round, and reactive to light.  Cardiovascular:     Rate and Rhythm: Normal rate.  Pulmonary:     Effort: Pulmonary effort is normal.  Abdominal:     General: Abdomen is flat.  Musculoskeletal:        General: Normal range of motion.     Cervical back: Normal range of motion.  Skin:    General: Skin is warm.  Neurological:     General: No focal deficit present.     Mental Status: He is alert.  Psychiatric:        Mood and Affect: Mood normal.  Assessment/Plan  Proceed as planned with robotic prostatectomy / node dissection for moderate risk disease. Risks, benefits, alternatives, expected peri-op course discussed previously and reiterated today.   Loletta Parish., MD 08/12/2022, 6:24 AM

## 2022-08-12 NOTE — Op Note (Signed)
NAMEPRATIK, TINGLE. MEDICAL RECORD NO: [id number] ACCOUNT NO: [id number] DATE OF BIRTH: Aug 28, 1965 FACILITY: [facility] LOCATION: [facility] PHYSICIAN: Sebastian Ache, MD  Operative Report   DATE OF PROCEDURE: 08/12/2022  PREOPERATIVE DIAGNOSIS:  Moderate risk prostate cancer.  POSTOPERATIVE DIAGNOSIS:  Moderate risk prostate cancer plus left inguinal hernia.  PROCEDURES PERFORMED: 1.  Robotic-assisted laparoscopic radical prostatectomy with pelvic lymph node dissection. 2.  Left laparoscopic inguinal hernia repair.  ESTIMATED BLOOD LOSS:  50 mL.  COMPLICATIONS:  None.  SPECIMEN:  Left external iliac lymph nodes, left obturator lymph nodes, right external iliac lymph nodes, the right obturator lymph nodes and prostatectomy.  FINDINGS:  1.  No evidence of sentinel lymph nodes within the abdomen. 2.  Direct left inguinal hernia fascial defect approximately 3 cm.  DRAINS:  1.  Jackson-Pratt drain to bulb suction. 2.  Foley catheter to straight drain.  ASSISTANT:  Harrie Foreman, PA  INDICATIONS:  Brynda Greathouse is a pleasant 57 year old man who was found on workup of elevated PSA to have multifocal adenocarcinoma of the prostate, moderate risk mostly on the right side.  Due to his younger age and very good functional status, he was  certainly counseled towards curative management options and he has elected surgery.  He presents for this today.  Informed consent was obtained and placed in medical record.  He does have a cardiology clearance for incidental bundle branch block, was  asymptomatic.  PROCEDURE IN DETAIL:  The patient being Toribio Ovando verified and the procedure being radical prostatectomy with node dissection was confirmed.  Procedure timeout was performed.  Intravenous antibiotics were administered.  General endotracheal  anesthesia induced.  The patient was placed into a low lithotomy position.  Sterile field was created, prepped and draped the patient's penis,  perineum, and proximal thighs using iodine and his infra-xiphoid abdomen using chlorhexidine gluconate after he  was clipper shaven and further fastened to operating table using 3-inch tape and over foam padding across supraxiphoid chest.  His arms were tucked to the side with gel rolls.  Foley catheter was placed free to straight drain.  Next, a high flow, low  pressure, pneumoperitoneum was obtained using Veress technique in the supraumbilical midline having passed the aspiration and drop test.  An 8 mm robotic camera port was then placed in same location.  Laparoscopic examination of peritoneal cavity  revealed no significant adhesions, no visceral injury.  Additional ports were then placed as follows:  Right paramedian 8 mm robotic port, right far lateral 12 mm AirSeal assist port, right paramedian 5 mm suction port, left paramedian 8 mm robotic port,  left far lateral 8 mm robotic port.  Robot was docked, having passed the electronic checks.  Initial attention was directed at development of space of Retzius.  Incision made lateral to the right median umbilical ligament from the midline towards the  area of internal ring, coursing along the iliac vessels towards the area of the right ureter.  Right vas deferens was encountered, ligated using medial bucket handle and the right bladder wall swept away the pelvic sidewall towards the area of the  endopelvic fascia on the right side.  A mirror image dissection was performed on the left side.  Anterior attachments were taken down with cautery scissors, exposed the anterior base of the prostate, which was defatted to better denote the prostate,  bladder neck junction.  This tissue was set aside labeled as periprostatic lymph nodes.  Next, 0.2 mL of indocyanine green dye was injected in  each lobe of the prostate using percutaneously placed robotically guided spinal needle for sentinel  lymphangiography. The endopelvic fascia was then swept away from the lateral  side of the prostate in base to apex orientation.  This exposed the dorsal venous complex and this was carefully controlled using green load stapler, taking exquisite care to  avoid any membranous urethral injury.  Then, approximately 10 minutes post-dye injection, the pelvis was inspected under near infrared fluorescence light.  Sentinel lymphangiography revealed excellent parenchymal uptake of the prostate.  There were several lymphatic channels seen coursing towards the area of the pelvic lymph node fields however, there were no obvious sentinel lymph nodes within the pelvis.   As such, standard template lymphadenectomy was performed first on the right side of the right external iliac group with boundaries being right external iliac artery, vein, pelvic sidewall, iliac bifurcation.  Lymphostasis achieved with cold clips, set  aside labeled as right external lymph nodes.  Next, right obturator group was dissected free, the boundaries being right external iliac vein, pelvic sidewall, obturator nerve.  Lymphostasis was achieved with cold clips set aside labeled as right  obturator lymph nodes.  The right obturator nerve was inspected following maneuvers and found to be uninjured.  A mirror image lymphadenectomy was performed of the left side, left external iliac, left obturator group respectively and left obturator nerve  was similarly unremarkable.  Attention was then directed at bladder neck dissection.  Bladder neck was identified moving the Foley catheter back and forth and a lateral release was performed on each side to better denote the bladder neck and prostate  caliber and the bladder neck was separated from the prostate in anterior, posterior direction, given what appeared to be a rim of circular muscle fibers at each plane of dissection, thus there was complete bladder neck preservation.  Posterior dissection  was performed by incising approximately 7 mm inferior posterior to the posterior lip of  the prostate, and then entering the plane of Denonvilliers.  Bilateral seminal vesicles and vas deferens were encountered.  The vas were dissected for a distance of  approximately 4 cm, ligated and placed on gentle superior traction.  Bilateral seminal vesicles were dissected to their tips and placed on gentle superior traction.  Dissection continued within this plane towards the area of the apex of the prostate,  which was identified by anterior curvature.  This exposed the vascular pedicles, which were controlled using a sequential clipping technique in a base to apex orientation with purposeful wide dissection on the right, however, moderate to aggressive nerve  sparing on the left.  Final apical dissection was performed placing the prostate on superior traction transecting the membranous urethra coldly.  This completely freed up the prostatectomy.  Specimen was placed into an EndoCatch bag for later retrieval.   Next, digital rectal exam was performed using indicator glove and laparoscopic vision.  No evidence of rectal violation was noted.  Posterior dissection was performed using 3-0 V-Loc suture, reapproximating the posterior urethral plate to the posterior  bladder neck, bringing the structures into tension-free apposition and mucosa-to-mucosa anastomosis was performed using double armed 3-0 V-Loc suture from the 6 o'clock to the 12 o'clock position which resulted in excellent tension-free apposition.  A  Foley catheter was placed per urethra, which irrigated quantitatively.  Sponge and needle counts were correct.  Hemostasis was excellent.  We achieved the goals extubated portion of procedure today.  Notably, during the development of space of Retzius on the left side,  we uncovered a direct inguinal hernia fascial defect approximately 3 cm.  Given that we developed space of Retzius, I was quite concerned that this could cause future strangulation.   As such, simple repair was performed using  figure-of-eight Ethibond x 2 reapproximating the true fascial edges which revealed an excellent resolution of the hernia defect.  A closed suction drain was brought through the previous left lateral most robotic  site into the peritoneal cavity.  Robot was then undocked.  The right lateral most assistant port site was closed with fascia using Carter-Thomason suture passer and 0 Vicryl.  Specimen was retrieved by extending the previous camera port site inferiorly  for a total distance of approximately 3 cm, removing the prostatectomy specimen and setting aside for permanent pathology.  The site was closed over the fascia using figure-of-eight PDS x 3 followed by reapproximation of Scarpa's with running Vicryl.   All incision sites were infiltrated with dilute lipolyzed Marcaine and closed at the level of the skin using subcuticular Monocryl followed by Dermabond.  Procedure was then terminated.  The patient tolerated procedure well, no immediate periprocedural  complications.  The patient was taken to postanesthesia care unit in stable condition.  Plan for observation admission.  Please note, first assistant, Harrie Foreman, was crucial for all portions surgery today.  She provided invaluable retraction, suctioning, vascular clipping, vascular stapling, lymphatic clipping, robotic instrument exchange and general first assistance.   MUK D: 08/12/2022 10:46:08 am T: 08/12/2022 11:05:00 am  JOB: [id number]/ [id number]

## 2022-08-12 NOTE — Brief Op Note (Signed)
08/12/2022  10:37 AM  PATIENT:  Brandon Avila  57 y.o. male  PRE-OPERATIVE DIAGNOSIS:  PROSTATE CANCER  POST-OPERATIVE DIAGNOSIS:  PROSTATE CANCER  PROCEDURE:  Procedure(s) with comments: XI ROBOTIC ASSISTED LAPAROSCOPIC RADICAL PROSTATECTOMY AND INDOCYANINE GREEN DYE INJECTION (N/A) - 3 HRS PELVIC LYMPH NODE DISSECTION (Bilateral) LEFT INGUINAL HERNIA REPAIR  SURGEON:  Surgeons and Role:    * [place] Carmack, Delbert Phenix., MD - Primary  PHYSICIAN ASSISTANT:   ASSISTANTS: Harrie Foreman PA   ANESTHESIA:   local and general  EBL:  25 mL   BLOOD ADMINISTERED:none  DRAINS:  1 - JP to bulb; 2 - Foley to gravity    LOCAL MEDICATIONS USED:  MARCAINE     SPECIMEN:  Source of Specimen:  1 - pelvic lymph nodes; 2- prostatectomy  DISPOSITION OF SPECIMEN:  PATHOLOGY  COUNTS:  YES  TOURNIQUET:  * No tourniquets in log *  DICTATION: .Other Dictation: Dictation Number [id number]  PLAN OF CARE: Admit for overnight observation  PATIENT DISPOSITION:  PACU - hemodynamically stable.   Delay start of Pharmacological VTE agent (>24hrs) due to surgical blood loss or risk of bleeding: yes

## 2022-08-13 ENCOUNTER — Encounter (HOSPITAL_COMMUNITY): Payer: Self-pay | Admitting: Urology

## 2022-08-13 DIAGNOSIS — C61 Malignant neoplasm of prostate: Secondary | ICD-10-CM | POA: Diagnosis not present

## 2022-08-13 DIAGNOSIS — K4090 Unilateral inguinal hernia, without obstruction or gangrene, not specified as recurrent: Secondary | ICD-10-CM | POA: Diagnosis not present

## 2022-08-13 DIAGNOSIS — Z87891 Personal history of nicotine dependence: Secondary | ICD-10-CM | POA: Diagnosis not present

## 2022-08-13 DIAGNOSIS — I10 Essential (primary) hypertension: Secondary | ICD-10-CM | POA: Diagnosis not present

## 2022-08-13 DIAGNOSIS — Z7982 Long term (current) use of aspirin: Secondary | ICD-10-CM | POA: Diagnosis not present

## 2022-08-13 DIAGNOSIS — Z79899 Other long term (current) drug therapy: Secondary | ICD-10-CM | POA: Diagnosis not present

## 2022-08-13 LAB — CBC
HCT: 39 % (ref 39.0–52.0)
Hemoglobin: 13.1 g/dL (ref 13.0–17.0)
MCH: 29.5 pg (ref 26.0–34.0)
MCHC: 33.6 g/dL (ref 30.0–36.0)
MCV: 87.8 fL (ref 80.0–100.0)
Platelets: 252 10*3/uL (ref 150–400)
RBC: 4.44 MIL/uL (ref 4.22–5.81)
RDW: 13.3 % (ref 11.5–15.5)
WBC: 13.5 10*3/uL — ABNORMAL HIGH (ref 4.0–10.5)
nRBC: 0 % (ref 0.0–0.2)

## 2022-08-13 MED ORDER — MENTHOL 3 MG MT LOZG
1.0000 | LOZENGE | OROMUCOSAL | Status: DC | PRN
  Administered 2022-08-13 (×2): 3 mg via ORAL
  Filled 2022-08-13: qty 9

## 2022-08-13 MED ORDER — ACETAMINOPHEN 325 MG PO TABS
650.0000 mg | ORAL_TABLET | Freq: Four times a day (QID) | ORAL | Status: DC | PRN
  Administered 2022-08-13: 650 mg via ORAL
  Filled 2022-08-13: qty 2

## 2022-08-13 MED ORDER — CHLORHEXIDINE GLUCONATE CLOTH 2 % EX PADS
6.0000 | MEDICATED_PAD | Freq: Every day | CUTANEOUS | Status: DC
  Administered 2022-08-13 – 2022-08-14 (×2): 6 via TOPICAL

## 2022-08-13 NOTE — Progress Notes (Signed)
Transition of Care [facility]) - Inpatient Brief Assessment   Patient Details  Name: Brandon Avila MRN: [id number] Date of Birth: 13-Nov-1965  Transition of Care [facility]) CM/SW Contact:    Brandon Prows, RN Phone Number: 08/13/2022, 4:04 PM   Clinical Narrative: Sherron Monday w/ pt and wife/POC Brandon Avila [phone] room; Brief assessment completed.   Transition of Care Asessment: Insurance and Status: Insurance coverage has been reviewed Patient has primary care physician: Yes Home environment has been reviewed: yes Prior level of function:: independent Prior/Current Home Services: No current home services Social Determinants of Health Reivew: SDOH reviewed no interventions necessary Readmission risk has been reviewed: Yes Transition of care needs: no transition of care needs at this time

## 2022-08-14 DIAGNOSIS — I10 Essential (primary) hypertension: Secondary | ICD-10-CM | POA: Diagnosis not present

## 2022-08-14 DIAGNOSIS — Z7982 Long term (current) use of aspirin: Secondary | ICD-10-CM | POA: Diagnosis not present

## 2022-08-14 DIAGNOSIS — K4090 Unilateral inguinal hernia, without obstruction or gangrene, not specified as recurrent: Secondary | ICD-10-CM | POA: Diagnosis not present

## 2022-08-14 DIAGNOSIS — C61 Malignant neoplasm of prostate: Secondary | ICD-10-CM | POA: Diagnosis not present

## 2022-08-14 DIAGNOSIS — Z87891 Personal history of nicotine dependence: Secondary | ICD-10-CM | POA: Diagnosis not present

## 2022-08-14 DIAGNOSIS — Z79899 Other long term (current) drug therapy: Secondary | ICD-10-CM | POA: Diagnosis not present

## 2022-08-14 NOTE — Plan of Care (Signed)
  Problem: Education: Goal: Knowledge of the procedure and recovery process will improve Outcome: Completed/Met   Problem: Bowel/Gastric: Goal: Gastrointestinal status for postoperative course will improve Outcome: Completed/Met   Problem: Pain Management: Goal: General experience of comfort will improve Outcome: Completed/Met   Problem: Skin Integrity: Goal: Demonstration of wound healing without infection will improve Outcome: Completed/Met   Problem: Urinary Elimination: Goal: Ability to avoid or minimize complications of infection will improve Outcome: Completed/Met Goal: Ability to achieve and maintain urine output will improve Outcome: Completed/Met Goal: Home care management will improve Outcome: Completed/Met   Problem: Education: Goal: Knowledge of General Education information will improve Description: Including pain rating scale, medication(s)/side effects and non-pharmacologic comfort measures Outcome: Completed/Met   Problem: Health Behavior/Discharge Planning: Goal: Ability to manage health-related needs will improve Outcome: Completed/Met   Problem: Clinical Measurements: Goal: Ability to maintain clinical measurements within normal limits will improve Outcome: Completed/Met Goal: Will remain free from infection Outcome: Completed/Met Goal: Diagnostic test results will improve Outcome: Completed/Met Goal: Respiratory complications will improve Outcome: Completed/Met Goal: Cardiovascular complication will be avoided Outcome: Completed/Met   Problem: Activity: Goal: Risk for activity intolerance will decrease Outcome: Completed/Met   Problem: Nutrition: Goal: Adequate nutrition will be maintained Outcome: Completed/Met   Problem: Coping: Goal: Level of anxiety will decrease Outcome: Completed/Met   Problem: Elimination: Goal: Will not experience complications related to bowel motility Outcome: Completed/Met Goal: Will not experience  complications related to urinary retention Outcome: Completed/Met   Problem: Pain Managment: Goal: General experience of comfort will improve Outcome: Completed/Met   Problem: Safety: Goal: Ability to remain free from injury will improve Outcome: Completed/Met   Problem: Skin Integrity: Goal: Risk for impaired skin integrity will decrease Outcome: Completed/Met

## 2022-08-14 NOTE — Final Progress Note (Signed)
Foley education completed with patient and wife, Brandon Avila. All questions answered regarding foley care. Pharmacy verified correct with wife. Follow-up appointment confirmed. Patient's PIV removed. Patient dressed himself with help from staff. Patient safely transported via wheelchair to main entrance for discharge.

## 2022-08-16 NOTE — Discharge Summary (Signed)
Physician Discharge Summary  Patient ID: Brandon Avila MRN: [id number] DOB/AGE: 58-Mar-1967 58 y.o.  Admit date: 08/12/2022 Discharge date: 08/14/2022  Admission Diagnoses:  Prostate cancer [facility])  Discharge Diagnoses:  Principal Problem:   Prostate cancer [facility])   Past Medical History:  Diagnosis Date   Arthritis    Cancer (HCC)    prostate cancer   History of kidney stones    Hypertension     Surgeries: Procedure(s): XI ROBOTIC ASSISTED LAPAROSCOPIC RADICAL PROSTATECTOMY AND INDOCYANINE GREEN DYE INJECTION PELVIC LYMPH NODE DISSECTION on 08/12/2022   Consultants (if any):   Discharged Condition: Improved  Hospital Course: Brandon Avila is an 57 y.o. male who was admitted 08/12/2022 with a diagnosis of Prostate cancer (HCC) and went to the operating room on 08/12/2022 and underwent the above named procedures.    He was given perioperative antibiotics:  Anti-infectives (From admission, onward)    Start     Dose/Rate Route Frequency Ordered Stop   08/12/22 0603  ceFAZolin (ANCEF) IVPB 2g/100 mL premix        2 g 200 mL/hr over 30 Minutes Intravenous 30 min pre-op 08/12/22 0603 08/12/22 0749   08/12/22 0000  sulfamethoxazole-trimethoprim (BACTRIM DS) 800-160 MG tablet        1 tablet Oral 2 times daily 08/12/22 1040       .  He was given sequential compression devices, early ambulation for DVT prophylaxis.  He benefited maximally from the hospital stay and there were no complications.    Recent vital signs:  Vitals:   08/14/22 0025 08/14/22 0531  BP:  112/73  Pulse:  70  Resp:  20  Temp: 99.5 F (37.5 C) 99.4 F (37.4 C)  SpO2:  93%    Recent laboratory studies:  Lab Results  Component Value Date   HGB 13.1 08/13/2022   HGB 13.6 08/13/2022   HGB 14.8 08/12/2022   Lab Results  Component Value Date   WBC 13.5 (H) 08/13/2022   PLT 252 08/13/2022   No results found for: "INR" Lab Results  Component Value Date   NA 135 08/13/2022   K 4.0  08/13/2022   CL 105 08/13/2022   CO2 23 08/13/2022   BUN 13 08/13/2022   CREATININE 1.02 08/13/2022   GLUCOSE 129 (H) 08/13/2022    Discharge Medications:   Allergies as of 08/14/2022   No Known Allergies      Medication List     STOP taking these medications    aspirin EC 325 MG tablet       TAKE these medications    amLODipine 5 MG tablet Commonly known as: NORVASC Take 5 mg by mouth every evening.   docusate sodium 100 MG capsule Commonly known as: COLACE Take 1 capsule (100 mg total) by mouth 2 (two) times daily.   HYDROcodone-acetaminophen 5-325 MG tablet Commonly known as: Norco Take 1-2 tablets by mouth every 6 (six) hours as needed for moderate pain or severe pain.   losartan 100 MG tablet Commonly known as: COZAAR Take 100 mg by mouth in the morning.   naphazoline-glycerin 0.012-0.25 % Soln Commonly known as: CLEAR EYES REDNESS Place 1-2 drops into both eyes 4 (four) times daily as needed (dry/irritated/red eyes.).   pantoprazole 40 MG tablet Commonly known as: PROTONIX Take 40 mg by mouth daily as needed (indigestion/heartburn.).   sulfamethoxazole-trimethoprim 800-160 MG tablet Commonly known as: BACTRIM DS Take 1 tablet by mouth 2 (two) times daily. Start the day prior to foley removal  appointment   traZODone 100 MG tablet Commonly known as: DESYREL Take 100 mg by mouth at bedtime.        Diagnostic Studies: No results found.  Disposition: Discharge disposition: 01-Home or Self Care       Discharge Instructions     Discharge patient   Complete by: As directed    Discharge disposition: 01-Home or Self Care   Discharge patient date: 08/14/2022        Follow-up Information     Loletta Parish., MD Follow up on 08/22/2022.   Specialty: Urology Why: at 9:30 for MD visit, pathology review, and catheter removal Contact information: [address] [place] [address] [phone]                   Signed: Wilkie Aye 08/16/2022, 9:00 AM

## 2022-08-22 DIAGNOSIS — N393 Stress incontinence (female) (male): Secondary | ICD-10-CM | POA: Diagnosis not present

## 2022-08-22 DIAGNOSIS — C61 Malignant neoplasm of prostate: Secondary | ICD-10-CM | POA: Diagnosis not present

## 2022-09-05 DIAGNOSIS — N509 Disorder of male genital organs, unspecified: Secondary | ICD-10-CM | POA: Diagnosis not present

## 2022-09-08 DIAGNOSIS — M6281 Muscle weakness (generalized): Secondary | ICD-10-CM | POA: Diagnosis not present

## 2022-09-08 DIAGNOSIS — N393 Stress incontinence (female) (male): Secondary | ICD-10-CM | POA: Diagnosis not present

## 2022-09-21 DIAGNOSIS — N393 Stress incontinence (female) (male): Secondary | ICD-10-CM | POA: Diagnosis not present

## 2022-09-21 DIAGNOSIS — M62838 Other muscle spasm: Secondary | ICD-10-CM | POA: Diagnosis not present

## 2022-09-21 DIAGNOSIS — M6281 Muscle weakness (generalized): Secondary | ICD-10-CM | POA: Diagnosis not present

## 2022-09-26 DIAGNOSIS — M62838 Other muscle spasm: Secondary | ICD-10-CM | POA: Diagnosis not present

## 2022-09-26 DIAGNOSIS — N393 Stress incontinence (female) (male): Secondary | ICD-10-CM | POA: Diagnosis not present

## 2022-09-26 DIAGNOSIS — M6281 Muscle weakness (generalized): Secondary | ICD-10-CM | POA: Diagnosis not present

## 2022-11-14 DIAGNOSIS — M62838 Other muscle spasm: Secondary | ICD-10-CM | POA: Diagnosis not present

## 2022-11-14 DIAGNOSIS — N393 Stress incontinence (female) (male): Secondary | ICD-10-CM | POA: Diagnosis not present

## 2022-11-14 DIAGNOSIS — M6281 Muscle weakness (generalized): Secondary | ICD-10-CM | POA: Diagnosis not present

## 2022-11-16 DIAGNOSIS — I10 Essential (primary) hypertension: Secondary | ICD-10-CM | POA: Diagnosis not present

## 2022-11-16 DIAGNOSIS — Z23 Encounter for immunization: Secondary | ICD-10-CM | POA: Diagnosis not present

## 2022-11-16 DIAGNOSIS — C61 Malignant neoplasm of prostate: Secondary | ICD-10-CM | POA: Diagnosis not present

## 2022-12-05 ENCOUNTER — Other Ambulatory Visit: Payer: Self-pay | Admitting: Urology

## 2022-12-05 ENCOUNTER — Telehealth: Payer: Self-pay | Admitting: Internal Medicine

## 2022-12-05 NOTE — Telephone Encounter (Signed)
   Pre-operative Risk Assessment    Patient Name: Brandon Avila  DOB: [birth year] MRN: [id number]      Request for Surgical Clearance    Procedure:   Excision of Left Scrotal Mass  Date of Surgery:  Clearance 01/06/23                                 Surgeon:  Dr. Verlin Dike. Surgeon's Group or Practice Name:  [facility] Phone number:  [phone] Fax number:  [phone]   Type of Clearance Requested:   - Medical    Type of Anesthesia:  General    Additional requests/questions:  Please advise surgeon/provider what medications should be held.  Signed, Belisicia T Woods   12/05/2022, 12:43 PM

## 2022-12-05 NOTE — Telephone Encounter (Signed)
   Name: Brandon Avila  DOB: [birth year]  MRN: [id number]  Primary Cardiologist: Marjo Bicker, MD   Preoperative team, please contact this patient and set up a phone call appointment for further preoperative risk assessment. Please obtain consent and complete medication review. Thank you for your help.  I confirm that guidance regarding antiplatelet and oral anticoagulation therapy has been completed and, if necessary, noted below.  None  I also confirmed the patient resides in the state of [place]. As per [facility] Medical Board telemedicine laws, the patient must reside in the state in which the provider is licensed.   Napoleon Form, Leodis Rains, NP 12/05/2022, 12:49 PM [facility]

## 2022-12-06 ENCOUNTER — Telehealth: Payer: Self-pay | Admitting: *Deleted

## 2022-12-06 NOTE — Telephone Encounter (Signed)
Pt has been scheduled tele pre op appt 12/21/22. Med rec and consent are done.      Patient Consent for Virtual Visit        Brandon Avila has provided verbal consent on 12/06/2022 for a virtual visit (video or telephone).   CONSENT FOR VIRTUAL VISIT FOR:  Brandon Avila  By participating in this virtual visit I agree to the following:  I hereby voluntarily request, consent and authorize [facility] and its employed or contracted physicians, physician assistants, nurse practitioners or other licensed health care professionals (the Practitioner), to provide me with telemedicine health care services (the "Services") as deemed necessary by the treating Practitioner. I acknowledge and consent to receive the Services by the Practitioner via telemedicine. I understand that the telemedicine visit will involve communicating with the Practitioner through live audiovisual communication technology and the disclosure of certain medical information by electronic transmission. I acknowledge that I have been given the opportunity to request an in-person assessment or other available alternative prior to the telemedicine visit and am voluntarily participating in the telemedicine visit.  I understand that I have the right to withhold or withdraw my consent to the use of telemedicine in the course of my care at any time, without affecting my right to future care or treatment, and that the Practitioner or I may terminate the telemedicine visit at any time. I understand that I have the right to inspect all information obtained and/or recorded in the course of the telemedicine visit and may receive copies of available information for a reasonable fee.  I understand that some of the potential risks of receiving the Services via telemedicine include:  Delay or interruption in medical evaluation due to technological equipment failure or disruption; Information transmitted may not be sufficient (e.g. poor  resolution of images) to allow for appropriate medical decision making by the Practitioner; and/or  In rare instances, security protocols could fail, causing a breach of personal health information.  Furthermore, I acknowledge that it is my responsibility to provide information about my medical history, conditions and care that is complete and accurate to the best of my ability. I acknowledge that Practitioner's advice, recommendations, and/or decision may be based on factors not within their control, such as incomplete or inaccurate data provided by me or distortions of diagnostic images or specimens that may result from electronic transmissions. I understand that the practice of medicine is not an exact science and that Practitioner makes no warranties or guarantees regarding treatment outcomes. I acknowledge that a copy of this consent can be made available to me via my patient portal [facility] MyChart), or I can request a printed copy by calling the office of [facility].    I understand that my insurance will be billed for this visit.   I have read or had this consent read to me. I understand the contents of this consent, which adequately explains the benefits and risks of the Services being provided via telemedicine.  I have been provided ample opportunity to ask questions regarding this consent and the Services and have had my questions answered to my satisfaction. I give my informed consent for the services to be provided through the use of telemedicine in my medical care

## 2022-12-06 NOTE — Telephone Encounter (Signed)
Pt has been scheduled tele pre op appt 12/21/22. Med rec and consent are done.

## 2022-12-21 ENCOUNTER — Ambulatory Visit: Payer: 59 | Attending: Cardiovascular Disease

## 2022-12-21 DIAGNOSIS — Z01810 Encounter for preprocedural cardiovascular examination: Secondary | ICD-10-CM | POA: Diagnosis not present

## 2022-12-21 NOTE — Progress Notes (Signed)
Virtual Visit via Telephone Note   Because of Brandon Avila's co-morbid illnesses, he is at least at moderate risk for complications without adequate follow up.  This format is felt to be most appropriate for this patient at this time.  The patient did not have access to video technology/had technical difficulties with video requiring transitioning to audio format only (telephone).  All issues noted in this document were discussed and addressed.  No physical exam could be performed with this format.  Please refer to the patient's chart for his consent to telehealth for [facility].  Evaluation Performed:  Preoperative cardiovascular risk assessment _____________   Date:  12/21/2022   Patient ID:  Brandon Avila, DOB 08/16/1965, MRN [id number] Patient Location:  Home Provider location:   Office  Primary Care Provider:  Carylon Perches, MD Primary Cardiologist:  Marjo Bicker, MD  Chief Complaint / Patient Profile   57 y.o. y/o male with a h/o hypertension, prostate cancer who is pending scrotal mass excision and presents today for telephonic preoperative cardiovascular risk assessment.  History of Present Illness    Brandon Avila is a 57 y.o. male who presents via audio/video conferencing for a telehealth visit today.  Pt was last seen in cardiology clinic on 08/04/2022 by Dr. Jenene Slicker.  At that time Brandon Avila was doing well .  The patient is now pending procedure as outlined above. Since his last visit, he remains stable from a cardiac standpoint.  Today he denies chest pain, shortness of breath, lower extremity edema, fatigue, palpitations, melena, hematuria, hemoptysis, diaphoresis, weakness, presyncope, syncope, orthopnea, and PND.   Past Medical History    Past Medical History:  Diagnosis Date   Arthritis    Cancer [facility])    prostate cancer   History of kidney stones    Hypertension    Past Surgical History:  Procedure Laterality Date    COLONOSCOPY N/A 09/28/2017   Procedure: COLONOSCOPY;  Surgeon: Malissa Hippo, MD;  Location[facility];  Service: Endoscopy;  Laterality: N/A;  730   LYMPH NODE DISSECTION Bilateral 08/12/2022   Procedure: PELVIC LYMPH NODE DISSECTION;  Surgeon: Loletta Parish., MD;  Location: [facility] ORS;  Service: Urology;  Laterality: Bilateral;   POLYPECTOMY  09/28/2017   Procedure: POLYPECTOMY;  Surgeon: Malissa Hippo, MD;  [facility];  Service: Endoscopy;;  colon   ROBOT ASSISTED LAPAROSCOPIC RADICAL PROSTATECTOMY N/A 08/12/2022   Procedure: XI ROBOTIC ASSISTED LAPAROSCOPIC RADICAL PROSTATECTOMY AND INDOCYANINE GREEN DYE INJECTION;  Surgeon: Loletta Parish., MD;  Location: [facility];  Service: Urology;  Laterality: N/A;  3 HRS    Allergies  No Known Allergies  Home Medications    Prior to Admission medications   Medication Sig Start Date End Date Taking? Authorizing Provider  amLODipine (NORVASC) 5 MG tablet Take 5 mg by mouth every evening. 12/30/21   [provider]  docusate sodium (COLACE) 100 MG capsule Take 1 capsule (100 mg total) by mouth 2 (two) times daily. 08/12/22   Harrie Foreman, PA-C  HYDROcodone-acetaminophen (NORCO) 5-325 MG tablet Take 1-2 tablets by mouth every 6 (six) hours as needed for moderate pain or severe pain. 08/12/22   Harrie Foreman, PA-C  losartan (COZAAR) 100 MG tablet Take 100 mg by mouth in the morning. 08/03/17   [provider]  naphazoline-glycerin (CLEAR EYES REDNESS) 0.012-0.25 % SOLN Place 1-2 drops into both eyes 4 (four) times daily as needed (dry/irritated/red eyes.).  [provider]  pantoprazole (PROTONIX) 40 MG tablet Take 40 mg by mouth daily as needed (indigestion/heartburn.).    [provider]  sulfamethoxazole-trimethoprim (BACTRIM DS) 800-160 MG tablet Take 1 tablet by mouth 2 (two) times daily. Start the day prior to foley removal appointment 08/12/22   Harrie Foreman, PA-C  traZODone (DESYREL)  100 MG tablet Take 100 mg by mouth at bedtime. 12/27/21   [provider]    Physical Exam    Vital Signs:  Brandon Avila does not have vital signs available for review today.  Given telephonic nature of communication, physical exam is limited. AAOx3. NAD. Normal affect.  Speech and respirations are unlabored.  Accessory Clinical Findings    None  Assessment & Plan    1.  Preoperative Cardiovascular Risk Assessment:Excision of Left Scrotal Mass, Dr. Verlin Dike.,  [facility] Phone number:  [phone] Fax number:  [phone]      Primary Cardiologist: Marjo Bicker, MD  Chart reviewed as part of pre-operative protocol coverage. Given past medical history and time since last visit, based on ACC/AHA guidelines, Brandon Avila would be at acceptable risk for the planned procedure without further cardiovascular testing.   Patient was advised that if he develops new symptoms prior to surgery to contact our office to arrange a follow-up appointment.  He verbalized understanding.  I will route this recommendation to the requesting party via Epic fax function and remove from pre-op pool.  Please call with questions.      Time:   Today, I have spent 5 minutes with the patient with telehealth technology discussing medical history, symptoms, and management plan.     Ronney Asters, NP  12/21/2022, 7:57 AM    Prior to patient's phone evaluation I spent greater than 10 minutes reviewing their past medical history and cardiac medications.

## 2022-12-22 NOTE — Patient Instructions (Addendum)
SURGICAL WAITING ROOM VISITATION Patients having surgery or a procedure may have no more than 2 support people in the waiting area - these visitors may rotate.    Children under the age of 20 must have an adult with them who is not the patient.  If the patient needs to stay at the hospital during part of their recovery, the visitor guidelines for inpatient rooms apply. Pre-op nurse will coordinate an appropriate time for 1 support person to accompany patient in pre-op.  This support person may not rotate.    Please refer to the [facility] website for the visitor guidelines for Inpatients (after your surgery is over and you are in a regular room).       Your procedure is scheduled on: 01-06-23   Report to [facility] Main Entrance    Report to admitting at 10:00 AM   Call this number if you have problems the morning of surgery [phone]   Do not eat food or drink liquids :After Midnight.          If you have questions, please contact your surgeon's office.   FOLLOW  ANY ADDITIONAL PRE OP INSTRUCTIONS YOU RECEIVED FROM YOUR SURGEON'S OFFICE!!!     Oral Hygiene is also important to reduce your risk of infection.                                    Remember - BRUSH YOUR TEETH THE MORNING OF SURGERY WITH YOUR REGULAR TOOTHPASTE   Do NOT smoke after Midnight   Take these medicines the morning of surgery with A SIP OF WATER:  None  Stop all vitamins and herbal supplements 7 days before surgery                              You may not have any metal on your body including jewelry, and body piercing             Do not wear  lotions, powders, cologne, or deodorant              Men may shave face and neck.   Do not bring valuables to the hospital. [facility] IS NOT RESPONSIBLE   FOR VALUABLES.   Contacts, dentures or bridgework may not be worn into surgery.  DO NOT BRING YOUR HOME MEDICATIONS TO THE HOSPITAL. PHARMACY WILL DISPENSE MEDICATIONS LISTED ON YOUR  MEDICATION LIST TO YOU DURING YOUR ADMISSION IN THE HOSPITAL!    Patients discharged on the day of surgery will not be allowed to drive home.  Someone NEEDS to stay with you for the first 24 hours after anesthesia.   Special Instructions: Bring a copy of your healthcare power of attorney and living will documents the day of surgery if you haven't scanned them before.              Please read over the following fact sheets you were given: IF YOU HAVE QUESTIONS ABOUT YOUR PRE-OP INSTRUCTIONS PLEASE CALL [phone] [facility]  If you received a COVID test during your pre-op visit  it is requested that you wear a mask when out in public, stay away from anyone that may not be feeling well and notify your surgeon if you develop symptoms. If you test positive for Covid or have been in contact with anyone that has tested positive in the last  10 days please notify you surgeon.  [facility] - Preparing for Surgery Before surgery, you can play an important role.  Because skin is not sterile, your skin needs to be as free of germs as possible.  You can reduce the number of germs on your skin by washing with CHG (chlorahexidine gluconate) soap before surgery.  CHG is an antiseptic cleaner which kills germs and bonds with the skin to continue killing germs even after washing. Please DO NOT use if you have an allergy to CHG or antibacterial soaps.  If your skin becomes reddened/irritated stop using the CHG and inform your nurse when you arrive at Short Stay. Do not shave (including legs and underarms) for at least 48 hours prior to the first CHG shower.  You may shave your face/neck.  Please follow these instructions carefully:  1.  Shower with CHG Soap the night before surgery and the  morning of surgery.  2.  If you choose to wash your hair, wash your hair first as usual with your normal  shampoo.  3.  After you shampoo, rinse your hair and body thoroughly to remove the shampoo.                             4.  Use  CHG as you would any other liquid soap.  You can apply chg directly to the skin and wash.  Gently with a scrungie or clean washcloth.  5.  Apply the CHG Soap to your body ONLY FROM THE NECK DOWN.   Do   not use on face/ open                           Wound or open sores. Avoid contact with eyes, ears mouth and   genitals (private parts).                       Wash face,  Genitals (private parts) with your normal soap.             6.  Wash thoroughly, paying special attention to the area where your    surgery  will be performed.  7.  Thoroughly rinse your body with warm water from the neck down.  8.  DO NOT shower/wash with your normal soap after using and rinsing off the CHG Soap.                9.  Pat yourself dry with a clean towel.            10.  Wear clean pajamas.            11.  Place clean sheets on your bed the night of your first shower and do not  sleep with pets. Day of Surgery : Do not apply any lotions/deodorants the morning of surgery.  Please wear clean clothes to the hospital/surgery center.  FAILURE TO FOLLOW THESE INSTRUCTIONS MAY RESULT IN THE CANCELLATION OF YOUR SURGERY  PATIENT SIGNATURE_________________________________  NURSE SIGNATURE__________________________________  ________________________________________________________________________

## 2022-12-23 NOTE — Progress Notes (Signed)
COVID Vaccine Completed:  Date of COVID positive in last 90 days:  PCP - Carylon Perches, MD Cardiologist - Luane School, MD  Cardiac clearance in Epic dated 12-21-22 by Edd Fabian, NP  Chest x-ray -  EKG - 08-01-22 Epic Stress Test -  ECHO -  Cardiac Cath -  Pacemaker/ICD device last checked: Spinal Cord Stimulator:  Bowel Prep -   Sleep Study -  CPAP -   Fasting Blood Sugar -  Checks Blood Sugar _____ times a day  Last dose of GLP1 agonist-  N/A GLP1 instructions:  Hold 7 days before surgery    Last dose of SGLT-2 inhibitors-  N/A SGLT-2 instructions:  Hold 3 days before surgery    Blood Thinner Instructions:  Time Aspirin Instructions: Last Dose:  Activity level:  Can go up a flight of stairs and perform activities of daily living without stopping and without symptoms of chest pain or shortness of breath.  Able to exercise without symptoms  Unable to go up a flight of stairs without symptoms of     Anesthesia review:  LBBB, HTN  Patient denies shortness of breath, fever, cough and chest pain at PAT appointment  Patient verbalized understanding of instructions that were given to them at the PAT appointment. Patient was also instructed that they will need to review over the PAT instructions again at home before surgery.

## 2022-12-27 ENCOUNTER — Encounter (HOSPITAL_COMMUNITY): Payer: Self-pay

## 2022-12-27 ENCOUNTER — Encounter (HOSPITAL_COMMUNITY)
Admission: RE | Admit: 2022-12-27 | Discharge: 2022-12-27 | Disposition: A | Discharge status: Home / Self Care | Payer: 59 | Source: Ambulatory Visit | Attending: Urology | Admitting: Urology

## 2022-12-27 ENCOUNTER — Other Ambulatory Visit: Payer: Self-pay

## 2022-12-27 VITALS — Ht 69.0 in | Wt 176.0 lb

## 2022-12-27 DIAGNOSIS — Z01818 Encounter for other preprocedural examination: Secondary | ICD-10-CM

## 2022-12-27 DIAGNOSIS — I10 Essential (primary) hypertension: Secondary | ICD-10-CM

## 2022-12-27 HISTORY — DX: Left bundle-branch block, unspecified: I44.7

## 2022-12-28 ENCOUNTER — Encounter (HOSPITAL_COMMUNITY)
Admission: RE | Admit: 2022-12-28 | Discharge: 2022-12-28 | Disposition: A | Discharge status: Home / Self Care | Payer: Self-pay | Source: Ambulatory Visit | Attending: Urology | Admitting: Urology

## 2022-12-28 DIAGNOSIS — Z01818 Encounter for other preprocedural examination: Secondary | ICD-10-CM

## 2022-12-28 DIAGNOSIS — Z01812 Encounter for preprocedural laboratory examination: Secondary | ICD-10-CM | POA: Insufficient documentation

## 2022-12-28 DIAGNOSIS — I10 Essential (primary) hypertension: Secondary | ICD-10-CM | POA: Diagnosis not present

## 2022-12-28 LAB — BASIC METABOLIC PANEL WITH GFR
Anion gap: 8 (ref 5–15)
BUN: 20 mg/dL (ref 6–20)
CO2: 24 mmol/L (ref 22–32)
Calcium: 9.2 mg/dL (ref 8.9–10.3)
Chloride: 105 mmol/L (ref 98–111)
Creatinine, Ser: 1.12 mg/dL (ref 0.61–1.24)
GFR, Estimated: >60 mL/min
Glucose, Bld: 92 mg/dL (ref 70–99)
Potassium: 3.8 mmol/L (ref 3.5–5.1)
Sodium: 137 mmol/L (ref 135–145)

## 2022-12-28 LAB — CBC
HCT: 43.1 % (ref 39.0–52.0)
Hemoglobin: 14.9 g/dL (ref 13.0–17.0)
MCH: 29 pg (ref 26.0–34.0)
MCHC: 34.6 g/dL (ref 30.0–36.0)
MCV: 84 fL (ref 80.0–100.0)
Platelets: 279 K/uL (ref 150–400)
RBC: 5.13 MIL/uL (ref 4.22–5.81)
RDW: 13.2 % (ref 11.5–15.5)
WBC: 6.4 K/uL (ref 4.0–10.5)
nRBC: 0 % (ref 0.0–0.2)

## 2022-12-29 NOTE — Progress Notes (Signed)
Anesthesia Chart Review   Case: [id number] Date/Time: 01/06/23 1200   Procedure: EXCISION OF LEFT SCROTAL MASS (Left) - 60 MINUTES NEEDED FOR CASE   Anesthesia type: General   Pre-op diagnosis: LEFT SCROTAL MASS   [facility]   Surgeons: Loletta Parish., MD       DISCUSSION:57 y.o. former smoker with h/o HTN, LBBB, left scrotal mass scheduled for above procedure 01/06/2023 with Dr. Sebastian Ache.   Per cardiology preoperative evaluation 12/21/2022, "Chart reviewed as part of pre-operative protocol coverage. Given past medical history and time since last visit, based on ACC/AHA guidelines, BRENTYN SKOOG would be at acceptable risk for the planned procedure without further cardiovascular testing."    VS: Ht 5\' 9"  (1.753 m)   Wt 79.8 kg   BMI 25.99 kg/m   PROVIDERS: Carylon Perches, MD is PCP   Primary Cardiologist: Marjo Bicker, MD  LABS: Labs reviewed: Acceptable for surgery. (all labs ordered are listed, but only abnormal results are displayed)  Labs Reviewed - No data to display   IMAGES:   EKG:   CV:  Past Medical History:  Diagnosis Date   Arthritis    Cancer (HCC)    prostate cancer   History of kidney stones    Hypertension    LBBB (left bundle branch block)     Past Surgical History:  Procedure Laterality Date   COLONOSCOPY N/A 09/28/2017   Procedure: COLONOSCOPY;  Surgeon: Malissa Hippo, MD;  [facility];  Service: Endoscopy;  Laterality: N/A;  730   LYMPH NODE DISSECTION Bilateral 08/12/2022   Procedure: PELVIC LYMPH NODE DISSECTION;  Surgeon: Loletta Parish., MD;  Location: [facility];  Service: Urology;  Laterality: Bilateral;   POLYPECTOMY  09/28/2017   Procedure: POLYPECTOMY;  Surgeon: Malissa Hippo, MD;  [facility];  Service: Endoscopy;;  colon   ROBOT ASSISTED LAPAROSCOPIC RADICAL PROSTATECTOMY N/A 08/12/2022   Procedure: XI ROBOTIC ASSISTED LAPAROSCOPIC RADICAL PROSTATECTOMY AND  INDOCYANINE GREEN DYE INJECTION;  Surgeon: Loletta Parish., MD;  Location: [facility];  Service: Urology;  Laterality: N/A;  3 HRS    MEDICATIONS:  amLODipine (NORVASC) 5 MG tablet   aspirin 325 MG tablet   docusate sodium (COLACE) 100 MG capsule   losartan (COZAAR) 100 MG tablet   naphazoline-glycerin (CLEAR EYES REDNESS) 0.012-0.25 % SOLN   pantoprazole (PROTONIX) 40 MG tablet   traZODone (DESYREL) 100 MG tablet   No current facility-administered medications for this encounter.     Jodell Cipro Ward, PA-C [facility] Pre-Surgical Testing [phone]

## 2023-01-06 ENCOUNTER — Other Ambulatory Visit: Payer: Self-pay

## 2023-01-06 ENCOUNTER — Ambulatory Visit (HOSPITAL_COMMUNITY)
Admission: RE | Admit: 2023-01-06 | Discharge: 2023-01-06 | Disposition: A | Discharge status: Home / Self Care | Payer: 59 | Attending: Urology | Admitting: Urology

## 2023-01-06 ENCOUNTER — Encounter (HOSPITAL_COMMUNITY)
Admission: RE | Disposition: A | Discharge status: Home / Self Care | Payer: Self-pay | Source: Home / Self Care | Attending: Urology

## 2023-01-06 ENCOUNTER — Ambulatory Visit (HOSPITAL_COMMUNITY): Payer: 59 | Admitting: Physician Assistant

## 2023-01-06 ENCOUNTER — Encounter (HOSPITAL_COMMUNITY): Payer: Self-pay | Admitting: Urology

## 2023-01-06 ENCOUNTER — Ambulatory Visit (HOSPITAL_BASED_OUTPATIENT_CLINIC_OR_DEPARTMENT_OTHER): Payer: 59 | Admitting: Certified Registered"

## 2023-01-06 DIAGNOSIS — N393 Stress incontinence (female) (male): Secondary | ICD-10-CM | POA: Insufficient documentation

## 2023-01-06 DIAGNOSIS — I447 Left bundle-branch block, unspecified: Secondary | ICD-10-CM | POA: Diagnosis not present

## 2023-01-06 DIAGNOSIS — Z87891 Personal history of nicotine dependence: Secondary | ICD-10-CM | POA: Diagnosis not present

## 2023-01-06 DIAGNOSIS — N529 Male erectile dysfunction, unspecified: Secondary | ICD-10-CM | POA: Insufficient documentation

## 2023-01-06 DIAGNOSIS — Z8546 Personal history of malignant neoplasm of prostate: Secondary | ICD-10-CM | POA: Diagnosis not present

## 2023-01-06 DIAGNOSIS — N5089 Other specified disorders of the male genital organs: Secondary | ICD-10-CM

## 2023-01-06 DIAGNOSIS — D1772 Benign lipomatous neoplasm of other genitourinary organ: Secondary | ICD-10-CM | POA: Diagnosis not present

## 2023-01-06 DIAGNOSIS — Z9079 Acquired absence of other genital organ(s): Secondary | ICD-10-CM | POA: Diagnosis not present

## 2023-01-06 DIAGNOSIS — I10 Essential (primary) hypertension: Secondary | ICD-10-CM | POA: Insufficient documentation

## 2023-01-06 DIAGNOSIS — N509 Disorder of male genital organs, unspecified: Secondary | ICD-10-CM | POA: Diagnosis not present

## 2023-01-06 HISTORY — PX: MASS EXCISION: SHX2000

## 2023-01-06 SURGERY — EXCISION MASS
Anesthesia: General | Site: Scrotum | Laterality: Left

## 2023-01-06 MED ORDER — LIDOCAINE 2% (20 MG/ML) 5 ML SYRINGE
INTRAMUSCULAR | Status: DC | PRN
  Administered 2023-01-06: 40 mg via INTRAVENOUS

## 2023-01-06 MED ORDER — ONDANSETRON HCL 4 MG/2ML IJ SOLN
INTRAMUSCULAR | Status: DC | PRN
  Administered 2023-01-06: 4 mg via INTRAVENOUS

## 2023-01-06 MED ORDER — PROPOFOL 10 MG/ML IV BOLUS
INTRAVENOUS | Status: AC
Start: 2023-01-06 — End: ?
  Filled 2023-01-06: qty 20

## 2023-01-06 MED ORDER — PROPOFOL 10 MG/ML IV BOLUS
INTRAVENOUS | Status: DC | PRN
  Administered 2023-01-06: 160 mg via INTRAVENOUS

## 2023-01-06 MED ORDER — ORAL CARE MOUTH RINSE: 15.0000 mL | Freq: Once | OROMUCOSAL | Status: AC

## 2023-01-06 MED ORDER — BUPIVACAINE HCL 0.25 % IJ SOLN
INTRAMUSCULAR | Status: AC
  Filled 2023-01-06: qty 1

## 2023-01-06 MED ORDER — OXYCODONE HCL 5 MG PO TABS: 5.0000 mg | ORAL_TABLET | Freq: Three times a day (TID) | ORAL | 0 refills | Status: AC | PRN

## 2023-01-06 MED ORDER — SENNOSIDES-DOCUSATE SODIUM 8.6-50 MG PO TABS: 1.0000 | ORAL_TABLET | Freq: Two times a day (BID) | ORAL | 0 refills | Status: AC

## 2023-01-06 MED ORDER — ACETAMINOPHEN 500 MG PO TABS
1000.0000 mg | ORAL_TABLET | Freq: Once | ORAL | Status: AC
  Administered 2023-01-06: 1000 mg via ORAL
  Filled 2023-01-06: qty 2

## 2023-01-06 MED ORDER — DEXAMETHASONE SODIUM PHOSPHATE 10 MG/ML IJ SOLN
INTRAMUSCULAR | Status: DC | PRN
  Administered 2023-01-06: 4 mg via INTRAVENOUS

## 2023-01-06 MED ORDER — MIDAZOLAM HCL 2 MG/2ML IJ SOLN
INTRAMUSCULAR | Status: AC
  Filled 2023-01-06: qty 2

## 2023-01-06 MED ORDER — FENTANYL CITRATE (PF) 100 MCG/2ML IJ SOLN
INTRAMUSCULAR | Status: AC
  Filled 2023-01-06: qty 2

## 2023-01-06 MED ORDER — BUPIVACAINE HCL (PF) 0.25 % IJ SOLN
INTRAMUSCULAR | Status: DC | PRN
  Administered 2023-01-06: 10 mL

## 2023-01-06 MED ORDER — ONDANSETRON HCL 4 MG/2ML IJ SOLN: 4.0000 mg | Freq: Once | INTRAMUSCULAR | Status: DC | PRN

## 2023-01-06 MED ORDER — LACTATED RINGERS IV SOLN: INTRAVENOUS | Status: DC

## 2023-01-06 MED ORDER — CHLORHEXIDINE GLUCONATE 0.12 % MT SOLN
15.0000 mL | Freq: Once | OROMUCOSAL | Status: AC
Start: 2023-01-06 — End: 2023-01-06
  Administered 2023-01-06: 15 mL via OROMUCOSAL

## 2023-01-06 MED ORDER — FENTANYL CITRATE (PF) 100 MCG/2ML IJ SOLN
INTRAMUSCULAR | Status: DC | PRN
  Administered 2023-01-06: 50 ug via INTRAVENOUS
  Administered 2023-01-06 (×2): 25 ug via INTRAVENOUS

## 2023-01-06 MED ORDER — MIDAZOLAM HCL 2 MG/2ML IJ SOLN
INTRAMUSCULAR | Status: DC | PRN
  Administered 2023-01-06: 2 mg via INTRAVENOUS

## 2023-01-06 MED ORDER — FENTANYL CITRATE PF 50 MCG/ML IJ SOSY: 25.0000 ug | PREFILLED_SYRINGE | INTRAMUSCULAR | Status: DC | PRN

## 2023-01-06 MED ORDER — ONDANSETRON HCL 4 MG/2ML IJ SOLN
INTRAMUSCULAR | Status: AC
  Filled 2023-01-06: qty 2

## 2023-01-06 MED ORDER — DEXAMETHASONE SODIUM PHOSPHATE 10 MG/ML IJ SOLN
INTRAMUSCULAR | Status: AC
  Filled 2023-01-06: qty 1

## 2023-01-06 MED ORDER — 0.9 % SODIUM CHLORIDE (POUR BTL) OPTIME
TOPICAL | Status: DC | PRN
  Administered 2023-01-06: 1000 mL

## 2023-01-06 MED ORDER — CEFAZOLIN SODIUM-DEXTROSE 2-4 GM/100ML-% IV SOLN
2.0000 g | INTRAVENOUS | Status: AC
  Administered 2023-01-06: 2 g via INTRAVENOUS
  Filled 2023-01-06: qty 100

## 2023-01-06 MED ORDER — AMISULPRIDE (ANTIEMETIC) 5 MG/2ML IV SOLN: 10.0000 mg | Freq: Once | INTRAVENOUS | Status: DC | PRN

## 2023-01-06 SURGICAL SUPPLY — 30 items
BAG COUNTER SPONGE SURGICOUNT (BAG) IMPLANT
BENZOIN TINCTURE PRP APPL 2/3 (GAUZE/BANDAGES/DRESSINGS) ×1 IMPLANT
BLADE HEX COATED 2.75 (ELECTRODE) ×1 IMPLANT
BLADE SURG 15 STRL LF DISP TIS (BLADE) ×1 IMPLANT
BNDG GAUZE DERMACEA FLUFF 4 (GAUZE/BANDAGES/DRESSINGS) ×1 IMPLANT
DERMABOND ADVANCED .7 DNX12 (GAUZE/BANDAGES/DRESSINGS) ×1 IMPLANT
DRAIN PENROSE 0.25X18 (DRAIN) ×1 IMPLANT
DRAIN PENROSE 0.5X18 (DRAIN) ×1 IMPLANT
DRAPE LAPAROTOMY T 98X78 PEDS (DRAPES) ×1 IMPLANT
ELECT REM PT RETURN 15FT ADLT (MISCELLANEOUS) ×1 IMPLANT
GAUZE SPONGE 4X4 12PLY STRL (GAUZE/BANDAGES/DRESSINGS) ×1 IMPLANT
GLOVE SURG LX STRL 7.5 STRW (GLOVE) ×1 IMPLANT
GOWN SRG XL LVL 4 BRTHBL STRL (GOWNS) ×1 IMPLANT
KIT BASIN OR (CUSTOM PROCEDURE TRAY) ×1 IMPLANT
KIT TURNOVER KIT A (KITS) IMPLANT
NDL HYPO 22X1.5 SAFETY MO (MISCELLANEOUS) IMPLANT
NEEDLE HYPO 22X1.5 SAFETY MO (MISCELLANEOUS)
NS IRRIG 1000ML POUR BTL (IV SOLUTION) IMPLANT
PACK BASIC VI WITH GOWN DISP (CUSTOM PROCEDURE TRAY) ×1 IMPLANT
PENCIL SMOKE EVACUATOR (MISCELLANEOUS) IMPLANT
SPONGE T-LAP 4X18 ~~LOC~~+RFID (SPONGE) ×2 IMPLANT
SUPPORTER AHLETIC TETRA LG (SOFTGOODS) ×1 IMPLANT
SUT MNCRL AB 4-0 PS2 18 (SUTURE) ×1 IMPLANT
SUT SILK 0 30XBRD TIE 6 (SUTURE) ×1 IMPLANT
SUT VIC AB 3-0 SH 27XBRD (SUTURE) ×1 IMPLANT
SYR 20ML LL LF (SYRINGE) ×1 IMPLANT
SYR BULB IRRIG 60ML STRL (SYRINGE) IMPLANT
SYR CONTROL 10ML LL (SYRINGE) IMPLANT
WATER STERILE IRR 1000ML POUR (IV SOLUTION) IMPLANT
YANKAUER SUCT BULB TIP 10FT TU (MISCELLANEOUS) IMPLANT

## 2023-01-06 NOTE — Brief Op Note (Signed)
01/06/2023  2:30 PM  PATIENT:  Brandon Avila  57 y.o. male  PRE-OPERATIVE DIAGNOSIS:  LEFT SCROTAL MASS  POST-OPERATIVE DIAGNOSIS:  LEFT SCROTAL MASS  PROCEDURE:  Procedure(s) with comments: EXCISION OF LEFT SCROTAL MASS (Left) - 60 MINUTES NEEDED FOR CASE  SURGEON:  Surgeons and Role:    * Merinda Victorino, Delbert Phenix., MD - Primary  PHYSICIAN ASSISTANT:   ASSISTANTS: none   ANESTHESIA:   local and general  EBL:  10 mL   BLOOD ADMINISTERED:none  DRAINS: none   LOCAL MEDICATIONS USED:  NONE  SPECIMEN:  Source of Specimen:  left scrotal (cord) mass  DISPOSITION OF SPECIMEN:  PATHOLOGY  COUNTS:  YES  TOURNIQUET:  * No tourniquets in log *  DICTATION: .Other Dictation: Dictation Number [id number]  PLAN OF CARE: Discharge to home after PACU  PATIENT DISPOSITION:  PACU - hemodynamically stable.   Delay start of Pharmacological VTE agent (>24hrs) due to surgical blood loss or risk of bleeding: yes

## 2023-01-06 NOTE — Anesthesia Preprocedure Evaluation (Signed)
Anesthesia Evaluation  Patient identified by MRN, date of birth, ID band Patient awake    Reviewed: Allergy & Precautions, NPO status , Patient's Chart, lab work & pertinent test results  Airway Mallampati: II  TM Distance: >3 FB Neck ROM: Full    Dental  (+) Teeth Intact, Dental Advisory Given   Pulmonary former smoker   Pulmonary exam normal breath sounds clear to auscultation       Cardiovascular hypertension, Pt. on medications Normal cardiovascular exam+ dysrhythmias (LBBB)  Rhythm:Regular Rate:Normal     Neuro/Psych negative neurological ROS     GI/Hepatic negative GI ROS, Neg liver ROS,,,  Endo/Other  negative endocrine ROS    Renal/GU negative Renal ROS   Left scrotal mass H/o prostate cancer     Musculoskeletal  (+) Arthritis ,    Abdominal   Peds  Hematology negative hematology ROS (+)   Anesthesia Other Findings Day of surgery medications reviewed with the patient.  Reproductive/Obstetrics                             Anesthesia Physical Anesthesia Plan  ASA: 2  Anesthesia Plan: General   Post-op Pain Management: Tylenol PO (pre-op)*   Induction: Intravenous  PONV Risk Score and Plan: 3 and Midazolam, Dexamethasone and Ondansetron  Airway Management Planned: LMA  Additional Equipment:   Intra-op Plan:   Post-operative Plan: Extubation in OR  Informed Consent: I have reviewed the patients History and Physical, chart, labs and discussed the procedure including the risks, benefits and alternatives for the proposed anesthesia with the patient or authorized representative who has indicated his/her understanding and acceptance.     Dental advisory given  Plan Discussed with: CRNA  Anesthesia Plan Comments:        Anesthesia Quick Evaluation

## 2023-01-06 NOTE — Op Note (Unsigned)
NAMENEILSON, Brandon Avila. MEDICAL RECORD NO: [id number] ACCOUNT NO: [id number] DATE OF BIRTH: 10/08/1965 FACILITY: [facility] LOCATION: [facility] PHYSICIAN: Sebastian Ache, MD  Operative Report   DATE OF PROCEDURE: 01/06/2023  PREOPERATIVE DIAGNOSIS: Left paratesticular scrotal mass.  PROCEDURE PERFORMED: Excision of left scrotal mass.  ESTIMATED BLOOD LOSS:  Nil.  COMPLICATIONS:  None.  SPECIMENS: Left scrotal/paratesticular paracord mass, removed, to pathology.  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  No evidence of direct testicular or cord invasion.  INDICATIONS FOR PROCEDURE:  The patient is a pleasant 57 year old man with a history of prostate cancer status post prostatectomy previously.  He has done quite well from that perspective.  He was found on exams after this and complained of a firm but  mobile nodularity of the left scrotum.  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  There were no obvious palpable hernias.  Ultrasound corroborated paratesticular location of the mass in question.  Options were  discussed including observation versus surgical excision and he wished to proceed with the latter.  Informed consent was obtained and placed in the medical records.  DESCRIPTION OF PROCEDURE: The patient being Brandon Avila verified and the procedure being left scrotal mass excision was confirmed.  Timeout was performed.  Intravenous IV antibiotics were administered.  General LMA anesthesia was induced.  The patient was placed in the supine  position.  Sterile field was created, prepped and draped the patient's penis, perineum, and proximal thighs and scrotum using iodine.  Next, an incision was made approximately 4 cm in length along the median raphe towards the left scrotal compartment and  dissection was carried down to the tunics such that the left testicle and distal cord was  delivered in the operative field.  Inspection of the cord up to the area of the external ring, the mass in question did appear to be involved in a conglomeration  of fatty tissue that extended from the proximal cord, all the way towards the area of the epididymis.  The fatty tissue was separated away from the vas and other cord structures in order to protect the vascularity of the left testicle and the fatty  tissue was carefully dissected away from this from the area of the internal ring towards the area of the left testis.  The proximal communication of the fatty tissue was controlled using silk tie x 2.  Conglomeration of fatty tissue did incorporate the  nodularity in question and was set aside for permanant pathology.  Left testis remained visibly viable.  Vas was intact.  It was redelivered into the left scrotum.  Hemostasis was excellent.  It was not felt that a drain would be necessary.  Dartos was  reapproximated using running Vicryl.  Skin was reapproximated using running Monocryl.  5 mL of 0.25% plain Marcaine was instilled along the area of incision.  An additional 5 mL in a cord block fashion at the level of the external ring.  The procedure  was terminated.  The patient tolerated the procedure well.  No immediate perioperative complications.  The patient was taken to the post anesthesia care unit in stable condition.  Plan for discharge to home.   MUK D: 01/06/2023 2:35:03 pm T: 01/06/2023 8:17:00 pm  JOB: [id number]/ [id number]

## 2023-01-06 NOTE — Transfer of Care (Signed)
Immediate Anesthesia Transfer of Care Note  Patient: Brandon Avila  Procedure(s) Performed: EXCISION OF LEFT SCROTAL MASS (Left: Scrotum)  Patient Location: PACU  Anesthesia Type:General  Level of Consciousness: drowsy and patient cooperative  Airway & Oxygen Therapy: Patient Spontanous Breathing and Patient connected to face mask oxygen  Post-op Assessment: Report given to RN and Post -op Vital signs reviewed and stable  Post vital signs: Reviewed and stable  Last Vitals:  Vitals Value Taken Time  BP 144/88   Temp    Pulse 89 01/06/23 1435  Resp 11 01/06/23 1435  SpO2 97 % 01/06/23 1435  Vitals shown include unfiled device data.  Last Pain:  Vitals:   01/06/23 1045  TempSrc:   PainSc: 0-No pain      Patients Stated Pain Goal: 6 (01/06/23 1045)  Complications: No notable events documented.

## 2023-01-06 NOTE — Anesthesia Procedure Notes (Signed)
Procedure Name: LMA Insertion Date/Time: 01/06/2023 1:53 PM  Performed by: Sindy Guadeloupe, CRNAPre-anesthesia Checklist: Patient identified, Emergency Drugs available, Suction available, Patient being monitored and Timeout performed Patient Re-evaluated:Patient Re-evaluated prior to induction Oxygen Delivery Method: Circle system utilized Preoxygenation: Pre-oxygenation with 100% oxygen Induction Type: IV induction Ventilation: Mask ventilation without difficulty LMA: LMA inserted and LMA with gastric port inserted LMA Size: 4.0 Number of attempts: 1 Tube secured with: Tape Dental Injury: Teeth and Oropharynx as per pre-operative assessment

## 2023-01-06 NOTE — H&P (Signed)
Brandon Avila is an 57 y.o. male.    Chief Complaint: Pre-OP Excision of LEFT Scrotal Mass  HPI:   1 - Moderate Risk Prostate Cancer - s/p robotic radical prostatectomy and node dissection 07/2022 for pT2N0Mx grade 2 prostate cancer with negative margins. Pre-op PSA 4.0. TRUS 44mL no medain.   Summarized Post-Prostatectomy Course:  11/2022 - PSA <.01   2 - Stress Urinary Incontinence - s/p prostatecotmy 07/2022. Recived post-op PT. At 3mos 2 light pads per day 0 leak on CST   3 - Erectile Dysfunction - s/p prostatectomy 07/2022. Unaided gets minimal fullnes, may be interested in trimimx teaching [place] [facility] [place].   4 - LEFT Scrotal / Cord Mass - 2-3cm left mid cord mass that is firm, but mobile noted on self exam 2024. Separate from testis. Possible lipoma of cord.   PMH sig for HTN. NO CV disease / blood thinners. Wife Belinda involved. 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 His PCP is Carylon Perches MD in [place].   Today "Brynda Greathouse" is seen to proceed with excision of LEFT scrotal mass. No iterval fevers.    Past Medical History:  Diagnosis Date   Arthritis    Cancer [facility])    prostate cancer   History of kidney stones    Hypertension    LBBB (left bundle branch block)     Past Surgical History:  Procedure Laterality Date   COLONOSCOPY N/A 09/28/2017   Procedure: COLONOSCOPY;  Surgeon: Malissa Hippo, MD;  Location: [facility];  Service: Endoscopy;  Laterality: N/A;  730   LYMPH NODE DISSECTION Bilateral 08/12/2022   Procedure: PELVIC LYMPH NODE DISSECTION;  Surgeon: Loletta Parish., MD;  Location: [facility];  Service: Urology;  Laterality: Bilateral;   POLYPECTOMY  09/28/2017   Procedure: POLYPECTOMY;  Surgeon: Malissa Hippo, MD;  Location: [facility];  Service: Endoscopy;;  colon   ROBOT ASSISTED LAPAROSCOPIC RADICAL PROSTATECTOMY N/A 08/12/2022   Procedure: XI ROBOTIC ASSISTED  LAPAROSCOPIC RADICAL PROSTATECTOMY AND INDOCYANINE GREEN DYE INJECTION;  Surgeon: Loletta Parish., MD;  Location: [facility];  Service: Urology;  Laterality: N/A;  3 HRS    Family History  Problem Relation Age of Onset   Cancer Mother        Melanoma   Stroke Father        around 79yo- full recovery   Social History:  reports that he has quit smoking. His smoking use included cigarettes. He has never used smokeless tobacco. He reports current alcohol use. He reports that he does not use drugs.  Allergies: No Known Allergies  No medications prior to admission.    No results found for this or any previous visit (from the past 48 hours). No results found.  Review of Systems  Constitutional:  Negative for chills and fever.  Genitourinary:  Positive for penile swelling.  All other systems reviewed and are negative.   There were no vitals taken for this visit. Physical Exam Vitals reviewed.  HENT:     Head: Normocephalic.  Eyes:     Pupils: Pupils are equal, round, and reactive to light.  Cardiovascular:     Rate and Rhythm: Normal rate.  Abdominal:     General: Abdomen is flat.     Comments: Prior scars w/o hernias.   Genitourinary:    Comments: Stbale left para-testicular / para-cord fullness. No overt palpable hernias.  Musculoskeletal:  General: Normal range of motion.     Cervical back: Normal range of motion.  Skin:    General: Skin is warm.  Neurological:     General: No focal deficit present.     Mental Status: He is alert.  Psychiatric:        Mood and Affect: Mood normal.      Assessment/Plan  Proceed as planned with Excisoin of LEFT para-cord / para-testicualr mass. Risks, benefits, alternatives, expected peri-op course discussed. Goal is left gonadal preservation as long as appears safe intra-op. I did discuss risk of left orchiectomy if appears rare sarcoma.   Loletta Parish., MD 01/06/2023, 9:03 AM

## 2023-01-06 NOTE — Discharge Instructions (Signed)
1 - All stitches are dissolvable and will disappear in about 3 weeks. OK to shower at anytime. No heavy lifting / straddle activity x 2 weeks.   2 - Call MD or go to ER for fever >102, severe pain / nausea / vomiting not relieved by medications, or acute change in medical status

## 2023-01-07 NOTE — Anesthesia Postprocedure Evaluation (Signed)
Anesthesia Post Note  Patient: Brandon Avila  Procedure(s) Performed: EXCISION OF LEFT SCROTAL MASS (Left: Scrotum)     Patient location during evaluation: PACU Anesthesia Type: General Level of consciousness: awake and alert Pain management: pain level controlled Vital Signs Assessment: post-procedure vital signs reviewed and stable Respiratory status: spontaneous breathing, nonlabored ventilation and respiratory function stable Cardiovascular status: blood pressure returned to baseline and stable Postop Assessment: no apparent nausea or vomiting Anesthetic complications: no   No notable events documented.  Last Vitals:  Vitals:   01/06/23 1500 01/06/23 1517  BP: 138/88 (!) 130/94  Pulse: 79 78  Resp: 19 20  Temp:  36.4 C  SpO2: 92% 93%    Last Pain:  Vitals:   01/06/23 1517  TempSrc: Oral  PainSc: 0-No pain                 Collene Schlichter

## 2023-01-08 ENCOUNTER — Encounter (HOSPITAL_COMMUNITY): Payer: Self-pay | Admitting: Urology

## 2023-01-10 LAB — SURGICAL PATHOLOGY

## 2023-01-24 DIAGNOSIS — N393 Stress incontinence (female) (male): Secondary | ICD-10-CM | POA: Diagnosis not present

## 2023-03-15 DIAGNOSIS — I10 Essential (primary) hypertension: Secondary | ICD-10-CM | POA: Diagnosis not present

## 2023-03-15 DIAGNOSIS — C61 Malignant neoplasm of prostate: Secondary | ICD-10-CM | POA: Diagnosis not present

## 2023-03-15 DIAGNOSIS — Z79899 Other long term (current) drug therapy: Secondary | ICD-10-CM | POA: Diagnosis not present

## 2023-03-15 DIAGNOSIS — R7303 Prediabetes: Secondary | ICD-10-CM | POA: Diagnosis not present

## 2023-10-09 DIAGNOSIS — I10 Essential (primary) hypertension: Secondary | ICD-10-CM | POA: Diagnosis not present

## 2023-10-09 DIAGNOSIS — N5201 Erectile dysfunction due to arterial insufficiency: Secondary | ICD-10-CM | POA: Diagnosis not present

## 2023-11-27 DIAGNOSIS — C61 Malignant neoplasm of prostate: Secondary | ICD-10-CM | POA: Diagnosis not present

## 2023-12-04 DIAGNOSIS — N5201 Erectile dysfunction due to arterial insufficiency: Secondary | ICD-10-CM | POA: Diagnosis not present

## 2023-12-04 DIAGNOSIS — C61 Malignant neoplasm of prostate: Secondary | ICD-10-CM | POA: Diagnosis not present

## 2023-12-04 DIAGNOSIS — N393 Stress incontinence (female) (male): Secondary | ICD-10-CM | POA: Diagnosis not present
